# Patient Record
Sex: Male | Born: 2000 | Race: Black or African American | Hispanic: No | Marital: Single | Smoking: Never smoker
Health system: Southern US, Community
[De-identification: ages and names within clinical notes are randomized; demographics above are authoritative.]

## PROBLEM LIST (undated history)

## (undated) DIAGNOSIS — F419 Anxiety disorder, unspecified: Secondary | ICD-10-CM

## (undated) DIAGNOSIS — J309 Allergic rhinitis, unspecified: Secondary | ICD-10-CM

## (undated) DIAGNOSIS — G43909 Migraine, unspecified, not intractable, without status migrainosus: Secondary | ICD-10-CM

## (undated) DIAGNOSIS — R519 Headache, unspecified: Secondary | ICD-10-CM

## (undated) DIAGNOSIS — F329 Major depressive disorder, single episode, unspecified: Secondary | ICD-10-CM

## (undated) DIAGNOSIS — F909 Attention-deficit hyperactivity disorder, unspecified type: Secondary | ICD-10-CM

## (undated) DIAGNOSIS — R5383 Other fatigue: Secondary | ICD-10-CM

## (undated) DIAGNOSIS — M549 Dorsalgia, unspecified: Secondary | ICD-10-CM

## (undated) DIAGNOSIS — E669 Obesity, unspecified: Secondary | ICD-10-CM

## (undated) DIAGNOSIS — F32A Depression, unspecified: Secondary | ICD-10-CM

## (undated) DIAGNOSIS — E66811 Obesity, class 1: Secondary | ICD-10-CM

## (undated) HISTORY — DX: Headache, unspecified: R51.9

## (undated) HISTORY — DX: Attention-deficit hyperactivity disorder, unspecified type: F90.9

## (undated) HISTORY — DX: Migraine, unspecified, not intractable, without status migrainosus: G43.909

## (undated) HISTORY — PX: NO PAST SURGERIES: SHX2092

## (undated) HISTORY — DX: Other fatigue: R53.83

## (undated) HISTORY — DX: Dorsalgia, unspecified: M54.9

## (undated) HISTORY — DX: Anxiety disorder, unspecified: F41.9

## (undated) HISTORY — DX: Depression, unspecified: F32.A

## (undated) HISTORY — DX: Obesity, class 1: E66.811

## (undated) HISTORY — DX: Obesity, unspecified: E66.9

## (undated) HISTORY — DX: Allergic rhinitis, unspecified: J30.9

---
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
## 2019-01-02 ENCOUNTER — Encounter: Payer: Self-pay | Admitting: Psychiatry

## 2019-01-02 ENCOUNTER — Other Ambulatory Visit: Payer: Self-pay

## 2019-01-02 ENCOUNTER — Ambulatory Visit (INDEPENDENT_AMBULATORY_CARE_PROVIDER_SITE_OTHER): Payer: BC Managed Care – PPO | Admitting: Psychiatry

## 2019-01-02 VITALS — Ht 69.0 in | Wt 219.0 lb

## 2019-01-02 DIAGNOSIS — F902 Attention-deficit hyperactivity disorder, combined type: Secondary | ICD-10-CM

## 2019-01-02 DIAGNOSIS — F411 Generalized anxiety disorder: Secondary | ICD-10-CM

## 2019-01-02 DIAGNOSIS — F410 Panic disorder [episodic paroxysmal anxiety] without agoraphobia: Secondary | ICD-10-CM | POA: Diagnosis not present

## 2019-01-02 MED ORDER — DEXMETHYLPHENIDATE HCL ER 15 MG PO CP24: 15.0000 mg | ORAL_CAPSULE | Freq: Every day | ORAL | 0 refills | Status: DC

## 2019-01-02 MED ORDER — LORAZEPAM 1 MG PO TABS: 1.0000 mg | ORAL_TABLET | Freq: Two times a day (BID) | ORAL | 0 refills | Status: DC | PRN

## 2019-01-02 NOTE — Progress Notes (Signed)
[facility]  Patient ID: Wendall Isabell,  MRN: [id number]  PCP: Farris Has, MD  Date of Evaluation: 01/02/2019 Time spent:20 minutes from 0920 to 0940  Chief Complaint:  Chief Complaint    Panic Attack; Anxiety; ADHD      HISTORY/CURRENT STATUS: Richards is seen onsite in office individually 20 minutes face-to-face with consent with epic collateral for adolescent psychiatric interview in exam and 3-week evaluation and management of panic and generalized anxiety possibly more social anxiety with ADHD undermining transition to adulthood and career.  The patient required hospitalization in mid October not complying with Lexapro after discharge diagnosed with depression he denied being very quiet and almost mute at times though denying that he is shy.  However, panic anxiety has been the most debilitating symptommaking it difficult to leave the house, take part in activities, and prepare for the future.  He has been previously unable to tolerate these appointments in the past but does better today, talking still with the averted gaze and low volume but persisting throughout the 20 minutes then expressing thanks for accomplishing the session.  He has taken up to 60 tablets of lorazepam potentially every 3 weeks prescribed as 3 times daily as needed noting that initially the medication seemed to help him have less frequent anxiety but now the episodes are more frequent again though he has fear and aversion to preventative medication. He suggests last week and last night were both very difficult for many symptoms. He did receive his PC delivery and has been working on assemblying his computer.  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  He still declines the duloxetine suggested 20 mg daily.  He will not go back to the previous Lexapro.  He does inquire about options for ADHD treatment wondering if he can focus and function better, maybe he can become more successful at  extincting the panic and restoring an active life.  He does arrive on time today as another low-level indicator of engagement and investment in treatment with less anxiety.  He denies cannabis currently or other substance use.  He has no mania, suicidality, psychosis or delirium.  Anxiety Presents for follow up of problem starting 1 to 5 years ago. The problem has been waxing and waning. Symptoms include decreased concentration, dizziness, excessive worry, malaise, muscle tension, nausea, nervous/anxious behavior, panic, avoidance, inhibition, and restlessness. Patient reports no chest pain, compulsions, depressed mood, feeling of choking, obsessions or suicidal ideas. Symptoms occur multiple times dailly. The severe symptoms interfere with daily activities and moderate. The symptoms are aggravated by family issues, social activities and work stress. The quality of sleep is fair. Nighttime awakenings: one to two.   Individual Medical History/ Review of Systems: Changes? :No   Allergies: Pollen extract  Current Medications:  Current Outpatient Medications:  .  dexmethylphenidate (FOCALIN XR) 15 MG 24 hr capsule, Take 1 capsule (15 mg total) by mouth daily after breakfast., Disp: 30 capsule, Rfl: 0 .  DULoxetine HCl 20 MG CSDR, Take 20 mg by mouth at bedtime., Disp: 30 capsule, Rfl: 1 .  LORazepam (ATIVAN) 1 MG tablet, Take 1 tablet (1 mg total) by mouth 2 (two) times daily as needed for anxiety (panic)., Disp: 60 tablet, Rfl: 0 .  traZODone (DESYREL) 50 MG tablet, Take 1 tablet (50 mg total) by mouth at bedtime as needed for sleep., Disp: 15 tablet, Rfl: 0   Medication Side Effects: none  Family Medical/ Social History: Changes? No  MENTAL  HEALTH EXAM:  Height 5\' 9"  (1.753 m), weight 219 lb (99.3 kg).Body mass index is 32.34 kg/m. Muscle strengths and tone 5/5, postural reflexes and gait 0/0, and AIMS = 0 otherwise deferred for coronavirus shutdown  General Appearance: Casual, Fairly Groomed,  Guarded, Meticulous and Obese  Eye Contact:  Minimal  Speech:  Blocked, Clear and Coherent, Normal Rate and Talkative  Volume:  Normal  Mood:  Anxious, Euthymic and Worthless  Affect:  Congruent, Inappropriate, Restricted and Anxious  Thought Process:  Coherent, Goal Directed, Irrelevant, Linear and Descriptions of Associations: Tangential  Orientation:  Full (Time, Place, and Person)  Thought Content: Ilusions, Rumination and Tangential   Suicidal Thoughts:  No  Homicidal Thoughts:  No  Memory:  Immediate;   Good Remote;   Good  Judgement:  Fair  Insight:  Fair  Psychomotor Activity:  Normal, Decreased and Mannerisms  Concentration:  Concentration: Fair and Attention Span: Poor  Recall:  Good  Fund of Knowledge: Good  Language: Fair  Assets:  Desire for Improvement Resilience Talents/Skills  ADL's:  Intact  Cognition: WNL  Prognosis:  Good    DIAGNOSES:    ICD-10-CM   1. Panic disorder  F41.0 LORazepam (ATIVAN) 1 MG tablet  2. Generalized anxiety disorder  F41.1 LORazepam (ATIVAN) 1 MG tablet  3. Attention deficit hyperactivity disorder (ADHD), combined type, moderate  F90.2 dexmethylphenidate (FOCALIN XR) 15 MG 24 hr capsule    Receiving Psychotherapy: Yes with Apolonio Schneiders, PhD    RECOMMENDATIONS: Over 50% of the 20-minute face-to-face time is spent in total of 10 minutes counseling and coordination of care with cognitive behavioral exposure desensitization habit reversal response prevention behavioral nutrition, sleep hygiene, social skills, and frustration management interventions integrated as possible with the psychotherapy underway with Apolonio Schneiders, PhD and symptom treatment matching with medication.  Reviewing all medication options again including has mobilized with mother the first appointment here, he continues to decline the duloxetine 20 mg daily but accepts eScription for Focalin 15 mg XR every morning sent as #30 with no refill to [facility].   Fully anxiety is sufficiently contained with the lorazepam tolerate the Focalin and improve her personal function and responsible activity that working through anxiety can become more comfortable and successful to change over to duloxetine.  Ativan is increased to 1 mg tablet twice daily as needed for panic and social anxiety sent as #60 with no refill for generalized anxiety and panic disorders to [facility].  He returns for follow-up in 4 weeks continuing therapy with Dr. Cheryln Manly.   Delight Hoh, MD

## 2019-01-17 ENCOUNTER — Encounter: Payer: Self-pay | Admitting: Psychiatry

## 2019-01-17 ENCOUNTER — Ambulatory Visit (INDEPENDENT_AMBULATORY_CARE_PROVIDER_SITE_OTHER): Payer: BC Managed Care – PPO | Admitting: Psychiatry

## 2019-01-17 ENCOUNTER — Other Ambulatory Visit: Payer: Self-pay

## 2019-01-17 VITALS — Ht 69.0 in | Wt 214.0 lb

## 2019-01-17 DIAGNOSIS — F410 Panic disorder [episodic paroxysmal anxiety] without agoraphobia: Secondary | ICD-10-CM

## 2019-01-17 DIAGNOSIS — F411 Generalized anxiety disorder: Secondary | ICD-10-CM | POA: Diagnosis not present

## 2019-01-17 DIAGNOSIS — F902 Attention-deficit hyperactivity disorder, combined type: Secondary | ICD-10-CM

## 2019-01-17 MED ORDER — DEXMETHYLPHENIDATE HCL ER 15 MG PO CP24: 15.0000 mg | ORAL_CAPSULE | Freq: Every day | ORAL | 0 refills | Status: DC

## 2019-01-17 MED ORDER — ALPRAZOLAM 1 MG PO TABS: 1.0000 mg | ORAL_TABLET | Freq: Two times a day (BID) | ORAL | 0 refills | Status: DC | PRN

## 2019-01-17 NOTE — Progress Notes (Signed)
[facility]  Patient ID: Jeremiah [name],  MRN: [id number]  PCP: Jeremiah Has, MD  Date of Evaluation: 01/17/2019 Time spent:20 minutes from 1120 to 1140  Chief Complaint:  Chief Complaint    Panic Attack; Anxiety; ADHD      HISTORY/CURRENT STATUS: Jeremiah [name] is seen onsite in office 20 minutes face-to-face with consent with epic collateral for psychiatric interview and exam in 2-week evaluation and management of panic and generalized anxiety and ADHD to rule out previous depression.  In the interim, the patient reports partial improvement with Focalin 15 mg XR finding no blunting of his personality as he experienced in the past from ADHD medication treatment, so that he is pleased with the medication though finding modest benefit for focus.  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  The cardiorespiratory panic symptoms undermine his day experiencing only modest partial relief with Ativan despite having increased to the 1 mg tablet even when he doubles that.  He is now less concerned about when the next panic will be but is more concerned about getting over the panic when it occurs.  He does not find the Focalin increases the panic frequency or severity thus far.  He continues to decline Cymbalta or other antianxiety antidepressant to help prevent the panic.  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  [place] registry documents last Xanax and Focalin dispensing on 01/02/2019.  In the meantime, the patient called the [facility] nurse line who told him about Xanax and Librium.  Patient sees Jeremiah [name] on 01/19/2018 but [name] not seen him since the last appointment here.  The patient communicates much more effectively today both in process and content.  Overall he appears to be making slow progress but worries he will not be more completely improved.   He seeks to change the Ativan and increase when possible the Focalin but allows teaching about the order of such and ways to work through one area of improvement before adding treatment on another that interrupts the previous.  He does inquire whether he can drink some alcohol on a day when he does not take any benzodiazepine as to previous education. However, overall he still states he does not drink.  Weight is down 4 to 5 pounds since Focalin.  He [name] no mania, suicidality, psychosis or delirium.  Anxiety Presents forfollow up of problem starting1 to 5 years ago. The problem [name] beengradually improving. Symptoms includedecreased concentration,dizziness,excessive worry,muscle tension,nausea,nervous/anxious behavior,panic, avoidance, and inhibition.. Patient reports nochest pain,compulsions,depressed mood, malaise,restlessness, feeling of choking,obsessionsor suicidal ideas. Symptoms occur multiple times dailly. The severe symptomsinterfere with daily activities and moderate. The symptoms are aggravated byfamily issues, social activities and work stress. The quality of sleep isfair. Nighttime awakenings:one to two.   Individual Medical History/ Review of Systems: Changes? :No With ongoing stepwise adjustments of medications likely partially required by patient's access to social media having interested in music production, there are no contraindications to his and [employer] nurse request for Xanax though generally being a medication that is more potentially problematic than Ativan.  Allergies: Pollen extract  Current Medications:  Current Outpatient Medications:  .  ALPRAZolam (XANAX) 1 MG tablet, Take 1 tablet (1 mg total) by mouth 2 (two) times daily as needed for anxiety (panic attack)., Disp: 60 tablet, Rfl: 0 .  [START ON 02/01/2019] dexmethylphenidate (FOCALIN XR) 15  MG 24 hr capsule, Take 1 capsule (15 mg total) by mouth daily after breakfast., Disp: 30 capsule, Rfl: 0 .   DULoxetine HCl 20 MG CSDR, Take 20 mg by mouth at bedtime., Disp: 30 capsule, Rfl: 1 .  traZODone (DESYREL) 50 MG tablet, Take 1 tablet (50 mg total) by mouth at bedtime as needed for sleep., Disp: 15 tablet, Rfl: 0  Medication Side Effects: none  Family Medical/ Social History: Changes? No  MENTAL HEALTH EXAM:  Height 5\' 9"  (1.753 m), weight 214 lb (97.1 kg).Body mass index is 31.6 kg/m. Muscle strengths and tone 5/5, postural reflexes and gait 0/0, and AIMS = 0 otherwise deferred for coronavirus shutdown  General Appearance: Casual, Guarded, Meticulous and Obese  Eye Contact:  Fair  Speech:  Clear and Coherent and Normal Rate  Volume:  Decreased  Mood:  Anxious, Dysphoric, Euthymic and Irritable  Affect:  Congruent, Inappropriate, Restricted and Anxious  Thought Process:  Coherent, Irrelevant, Linear and Descriptions of Associations: Tangential  Orientation:  Full (Time, Place, and Person)  Thought Content: Rumination and Tangential   Suicidal Thoughts:  No  Homicidal Thoughts:  No  Memory:  Immediate;   Good Remote;   Fair  Judgement:  Fair  Insight:  Fair and Lacking  Psychomotor Activity:  Normal and Increased  Concentration:  Concentration: Fair and Attention Span: Fair  Recall:  [employer] of Knowledge: Good  Language: Good  Assets:  Desire for Improvement Resilience Talents/Skills  ADL's:  Intact  Cognition: WNL  Prognosis:  Fair    DIAGNOSES:    ICD-10-CM   1. Panic disorder  F41.0 ALPRAZolam (XANAX) 1 MG tablet  2. Attention deficit hyperactivity disorder (ADHD), combined type, moderate  F90.2 dexmethylphenidate (FOCALIN XR) 15 MG 24 hr capsule  3. Generalized anxiety disorder  F41.1 ALPRAZolam (XANAX) 1 MG tablet    Receiving Psychotherapy: Yes  with Jeremiah Schneiders, PhD    RECOMMENDATIONS: Negative interpretations himself of past ADHD treatment leave patient constricted in approaching treatment of his panic.  We attempt to mobilize more complete  participation in treatment by overcoming the anxiety and executive dysfunction alienating the patient since ADHD treatment in childhood.  In processing all that he is learning, changing Ativan to Xanax will be his next step for stabilizing panic and generalized anxiety before considering increasing Focalin or adding IR booster dose.  I educate him again on Cymbalta options as well, as he still [name] never started the 20 mg daily he did fill.  Over 50% of the 20-minute face-to-face session is spent in such counseling and coordination of care for a total of 10 minutes as patient is much more communicative and differentiating today for learning and applying social skills, response prevention for panic, and task completion despite anxiety and ADHD.  He is E scribed Xanax in place of discontinued Ativan 1 mg twice daily as needed for panic sent as #60 with no refill to [facility].  Follow-up eScription for Focalin 15 mg XR every morning is sent for January 20th fill if taking consistently otherwise discussing options to be continued at next appointment and will see Dr. Cheryln Manly 01/19/2018 in the interim.  He otherwise returns in 3 weeks understanding prevention and monitoring and safety hygiene including any use of alcohol in the interim outside of time of his other medication.   Delight Hoh, MD

## 2019-01-30 ENCOUNTER — Ambulatory Visit: Payer: BC Managed Care – PPO | Admitting: Psychiatry

## 2019-02-07 ENCOUNTER — Other Ambulatory Visit: Payer: Self-pay

## 2019-02-07 ENCOUNTER — Encounter: Payer: Self-pay | Admitting: Psychiatry

## 2019-02-07 ENCOUNTER — Ambulatory Visit (INDEPENDENT_AMBULATORY_CARE_PROVIDER_SITE_OTHER): Payer: BC Managed Care – PPO | Admitting: Psychiatry

## 2019-02-07 VITALS — Ht 69.0 in | Wt 217.0 lb

## 2019-02-07 DIAGNOSIS — F902 Attention-deficit hyperactivity disorder, combined type: Secondary | ICD-10-CM

## 2019-02-07 DIAGNOSIS — F410 Panic disorder [episodic paroxysmal anxiety] without agoraphobia: Secondary | ICD-10-CM | POA: Diagnosis not present

## 2019-02-07 DIAGNOSIS — F4010 Social phobia, unspecified: Secondary | ICD-10-CM | POA: Diagnosis not present

## 2019-02-07 DIAGNOSIS — F411 Generalized anxiety disorder: Secondary | ICD-10-CM

## 2019-02-07 MED ORDER — ALPRAZOLAM 1 MG PO TABS: 1.0000 mg | ORAL_TABLET | Freq: Three times a day (TID) | ORAL | 1 refills | Status: DC | PRN

## 2019-02-07 MED ORDER — DEXMETHYLPHENIDATE HCL ER 15 MG PO CP24: 15.0000 mg | ORAL_CAPSULE | Freq: Every day | ORAL | 0 refills | Status: DC

## 2019-02-07 NOTE — Progress Notes (Signed)
[facility]  Patient ID: Jeremiah [name],  MRN: [id number]  PCP: London Pepper, MD  Date of Evaluation: 02/07/2019 Time spent:20 minutes from 1125 to 1145  Chief Complaint:  Chief Complaint    Panic Attack; Anxiety; ADHD      HISTORY/CURRENT STATUS: [name] is seen onsite in office 20 minutes face-to-face individually with consent with epic collateral for psychiatric interview and exam in 3-week evaluation and management of panic and generalized anxiety likely also social anxiety and ADHD with possible depression concluded by the hospital inpatient care denied by the patient but clinical course of the last 3 weeks raising that differential again.  The patient has not seen Dr. Cheryln Manly though he greatly needs therapy, but he states he has an appointment in February.  The patient changed from Ativan 0.5 mg to Xanax 1 mg after talking to his [employer] but he states he is already tolerant to that.  Still he has not completed the first bottle of 60 having a few tablets remaining and he did not obtain the Focalin from last appointment 01/17/2019 according to [facility] registry.  He is talking less clearly today mumbling with anxious avoidance though not intoxication showing less comfort with the therapeutic process in the session.  Clinically this seems to represent social anxiety patient having poor eye contact and low-volume voice mumbling in his Covid mask with fewer specifics as to activity or symptoms.  His difficulty conversing seems to likely explain explain lack of content rather than the patient not knowing the content or attempting to mask the content.  He wonders still if higher doses of Xanax would resolve all of his symptoms, whereas I again remind him of the Cymbalta prescription he states he filled but has not started in place of Lexapro from the hospital which initial aftercare with the [place] wanted increased the  from 10 to 20 mg Lexapro after  leaving the hospital but he came here instead off that medication.  Extending the time between appointments has been stressful to the patient but may be the only way to define more clearly the treatment targets.  Boundaries and limits are therefore set with the patient for the Xanax and the Focalin encouraging the Cymbalta also be applied for 2 months as he works with Dr. Cheryln Manly to return at that time for reassessment.  Has no mania, suicidality, psychosis or delirium.  Anxiety  Presents forfollow up of problem starting1 to 5 years ago. The problem had beengradually improving but is now waxing and waning if not getting worse again. Symptoms includedecreased concentration, low motivation initiative,excessive worry,muscle tension,obsessive slowness, nervous/anxious behavior,panic,avoidance, and inhibition. Patient reports nochest pain,dizziness, nausea, compulsions,depressed mood, malaise,restlessness, feeling of choking,obsessionsor suicidal ideas. Symptoms occurmultiple timesdailly. The severesymptomsinterferewith daily activities and moderate. The symptoms are aggravated byfamily issues, social activities and work stress. The quality of sleep isfair. Nighttime awakenings:one to two.  Individual Medical History/ Review of Systems: Changes? :Yes weight varying between 214 and 219 pounds now 217.  Allergies: Pollen extract  Current Medications:  Current Outpatient Medications:  .  ALPRAZolam (XANAX) 1 MG tablet, Take 1 tablet (1 mg total) by mouth 3 (three) times daily as needed for anxiety (panic attack)., Disp: 90 tablet, Rfl: 1 .  dexmethylphenidate (FOCALIN XR) 15 MG 24 hr capsule, Take 1 capsule (15 mg total) by mouth daily after breakfast., Disp: 30 capsule, Rfl: 0 .  [START ON 03/09/2019] dexmethylphenidate (FOCALIN XR) 15 MG 24 hr capsule, Take 1 capsule (15 mg total)  by mouth daily., Disp: 30 capsule, Rfl: 0 .  DULoxetine HCl 20 MG CSDR, Take 20 mg by mouth at bedtime.,  Disp: 30 capsule, Rfl: 1 .  traZODone (DESYREL) 50 MG tablet, Take 1 tablet (50 mg total) by mouth at bedtime as needed for sleep., Disp: 15 tablet, Rfl: 0   Medication Side Effects: none  Family Medical/ Social History: Changes? No  MENTAL HEALTH EXAM:  Height 5\' 9"  (1.753 m), weight 217 lb (98.4 kg).Body mass index is 32.05 kg/m. Muscle strengths and tone 5/5, postural reflexes and gait 0/0, and AIMS = 0 otherwise deferred for coronavirus shutdown  General Appearance: Casual, Fairly Groomed, Guarded and Obese  Eye Contact:  Minimal  Speech:  Blocked, Garbled and Normal Rate  Volume:  Decreased  Mood:  Anxious, Dysphoric, Euthymic and Worthless  Affect:  Congruent, Inappropriate, Restricted and Anxious  Thought Process:  Coherent, Irrelevant, Linear and Descriptions of Associations: Tangential  Orientation:  Full (Time, Place, and Person)  Thought Content: Logical, Paranoid Ideation, Rumination and Tangential   Suicidal Thoughts:  No  Homicidal Thoughts:  No  Memory:  Immediate;   Good Remote;   Fair  Judgement:  Fair to limited  Insight:  Fair and Lacking  Psychomotor Activity:  Normal, Increased, Decreased, Mannerisms and Restlessness  Concentration:  Concentration: Fair and Attention Span: Fair  Recall:  of Knowledge: Good  Language: Fair  Assets:  Leisure Time Resilience Talents/Skills  ADL's:  Intact  Cognition: WNL  Prognosis:  Fair    DIAGNOSES:    ICD-10-CM   1. Panic disorder  F41.0 ALPRAZolam (XANAX) 1 MG tablet  2. Social anxiety disorder  F40.10 ALPRAZolam (XANAX) 1 MG tablet  3. Generalized anxiety disorder  F41.1 ALPRAZolam (XANAX) 1 MG tablet  4. Attention deficit hyperactivity disorder (ADHD), combined type, moderate  F90.2 dexmethylphenidate (FOCALIN XR) 15 MG 24 hr capsule    dexmethylphenidate (FOCALIN XR) 15 MG 24 hr capsule    DISCONTINUED: dexmethylphenidate (FOCALIN XR) 15 MG 24 hr capsule    Receiving Psychotherapy: Yes With Dr.  [employer], PhD in early February   RECOMMENDATIONS: Initial stepwise improvement in 4 sessions over 10 weeks as if exposure response prevention CBT manifesting some gradual success in combination with medications seems now regressed in the last 3 weeks as the patient relatively shuts down as if limited intent and attempt on his part to participate in treatment whether increased anxiety, overmedicating contributing to expectation for more, or just a difficult anxious disappointing or depressing day.  As there is no other clinical technique for the session to clarify by questions of the patient and observations of his responses necessary explanation, we minimize any change in medication expecting his therapy to restart with Dr. March while patient must reconsider starting the Cymbalta.  He has not been taking the Focalin that I can determine though he states it is of value without adverse effect so that medication is updated as well.  As the patient does not establish goals himself nor does he resolve concerns with pharmacy, the uncertainty of any fill or failure to be received by the pharmacy of the Focalin prompts resending the Focalin having to entry error the use of the existing eScription to establish two new escriptions of Focalin 15 mg XR #30 each for January 26 and February 25 explaining these to the pharmacy in note.  Xanax is increased to 1 mg 3 times daily as he describes never catching up with the anxiety due to  tolerance not likely for the dosing schedule or potency of dose so that he may be describing lack of efficacy but if so needs to respond more than just doubling Xanax so E scribed as #90 with 1 refill [facility] for panic and generalized anxiety with additional newly added diagnosis of social anxiety to the existing ADHD with rule out differential of major depression.  It is necessary to extend the return time to 2 months from the current 3 weeks to expect the patient to make  more effort himself in his daily life integrated with his therapy to determine any subsequent changes necessary in medication.   Chauncey Mann, MD

## 2019-02-20 ENCOUNTER — Ambulatory Visit (INDEPENDENT_AMBULATORY_CARE_PROVIDER_SITE_OTHER): Payer: BC Managed Care – PPO | Admitting: Psychology

## 2019-02-20 DIAGNOSIS — F411 Generalized anxiety disorder: Secondary | ICD-10-CM

## 2019-03-13 ENCOUNTER — Ambulatory Visit (INDEPENDENT_AMBULATORY_CARE_PROVIDER_SITE_OTHER): Payer: BC Managed Care – PPO | Admitting: Psychology

## 2019-03-13 DIAGNOSIS — F411 Generalized anxiety disorder: Secondary | ICD-10-CM

## 2019-03-22 ENCOUNTER — Ambulatory Visit (INDEPENDENT_AMBULATORY_CARE_PROVIDER_SITE_OTHER): Payer: BC Managed Care – PPO | Admitting: Psychiatry

## 2019-03-22 ENCOUNTER — Encounter: Payer: Self-pay | Admitting: Psychiatry

## 2019-03-22 ENCOUNTER — Other Ambulatory Visit: Payer: Self-pay

## 2019-03-22 VITALS — Ht 69.0 in | Wt 220.0 lb

## 2019-03-22 DIAGNOSIS — F411 Generalized anxiety disorder: Secondary | ICD-10-CM | POA: Diagnosis not present

## 2019-03-22 DIAGNOSIS — F341 Dysthymic disorder: Secondary | ICD-10-CM

## 2019-03-22 DIAGNOSIS — F410 Panic disorder [episodic paroxysmal anxiety] without agoraphobia: Secondary | ICD-10-CM | POA: Diagnosis not present

## 2019-03-22 DIAGNOSIS — F4010 Social phobia, unspecified: Secondary | ICD-10-CM

## 2019-03-22 DIAGNOSIS — F902 Attention-deficit hyperactivity disorder, combined type: Secondary | ICD-10-CM

## 2019-03-22 MED ORDER — ALPRAZOLAM 1 MG PO TABS: 1.0000 mg | ORAL_TABLET | Freq: Three times a day (TID) | ORAL | 0 refills | Status: DC | PRN

## 2019-03-22 MED ORDER — DULOXETINE HCL 30 MG PO CPEP: 30.0000 mg | ORAL_CAPSULE | Freq: Every day | ORAL | 1 refills | Status: DC

## 2019-03-22 NOTE — Progress Notes (Signed)
[facility]  Patient ID: Jeremiah [name],  MRN: [id number]  PCP: London Pepper, MD  Date of Evaluation: 03/22/2019 Time spent:25 minutesfrom 1540 to [place]  Chief Complaint:  Chief Complaint    Panic Attack; Anxiety; Depression; ADHD      HISTORY/CURRENT STATUS: Jeremiah [name] is seen on site in office 25 minutes face-to-face individually with consent with epic collateral for psychiatric interview and exam in 6-week evaluation and management of panic disorder, generalized and social anxiety, ADHD and denial of depressive diagnosis.  Patient presented here for treatment maintaining that his intervention by [facility] and any surrounding care had incorrectly diagnosed depression while maintaining he just has anxiety and ADHD fixating his adult development.  However, in his medication management initially he stated he had picked up but not started Cymbalta at 20 mg daily, now maintaining that he never picked up the Cymbalta eScription to replace previous Lexapro he did not tolerate or accept compliantly.  He has been dispensed the Focalin once in the interim as per [facility] registry January 26 on the day of last appointment here, and the Xanax was last on February 1 therefore taking the Focalin at most 66% of the days.  He states the Focalin helps but seems to increase his anxiety as he estimates requiring the Xanax 1 to 3 tablets daily sometimes taking 2 at once.  He states he is not sleeping good as possibly another reason for not taking the Focalin daily.  However he has gained 3 pounds in the interim not losing any weight relative to Focalin suppression of appetite.  He did see Dr. Cheryln Manly for at least 1 session in the interim and states they concluded that treating depression as well as the anxiety and ADHD is important, and he recommended that the patient comply with the antidepressant.  Therefore the patient is now willing to start the Cymbalta and seems more genuinely motivated to succeed but  anxious and doubtful about treatment or success.  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  I encouraged him to bring that or other aspects of his evolving career for review at next session as he appears likely to continue treatment, though seeming to avoid as though closing down last appointment as if he just wanted more Xanax and otherwise no treatment.  He has no mania, suicidality, psychosis or delirium.  Otherwise, he then requests a sleeping pill today but accepts that cannot be combined with Xanax but can be switched over such as to Restoril.  Anxiety Presents forfollow up of problem starting1 to 5 years ago. The problem has beengradually improving. Symptoms includedecreased concentration,dizziness,excessive worry,muscle tension,nausea,nervous/anxious behavior,panic,avoidance,  reactive dysphoria, and inhibition.. Patient reports nochest pain,compulsions, malaise,restlessness, feeling of choking,obsessionsor suicidal ideas. Symptoms occurmultiple timesdailly. The severesymptomsinterferewith daily activities and moderate. The symptoms are aggravated byfamily issues, social activities and work stress. The quality of sleep isfair. Nighttime awakenings:one to two.  Individual Medical History/ Review of Systems: Changes? :Yes Weight is up 3 pounds from last appointment but only 2 pounds total in the last 4 months.  Allergies: Pollen extract  Current Medications:  Current Outpatient Medications:  .  ALPRAZolam (XANAX) 1 MG tablet, Take 1 tablet (1 mg total) by mouth 3 (three) times daily as needed for anxiety (panic attack)., Disp: 90 tablet, Rfl: 0 .  dexmethylphenidate (FOCALIN XR) 15 MG 24 hr capsule, Take 1 capsule (15 mg total) by mouth  daily after breakfast., Disp: 30 capsule, Rfl: 0 .  dexmethylphenidate (FOCALIN XR) 15 MG 24 hr capsule, Take 1 capsule (15 mg total) by  mouth daily., Disp: 30 capsule, Rfl: 0 .  DULoxetine (CYMBALTA) 30 MG capsule, Take 1 capsule (30 mg total) by mouth at bedtime., Disp: 30 capsule, Rfl: 1 .  traZODone (DESYREL) 50 MG tablet, Take 1 tablet (50 mg total) by mouth at bedtime as needed for sleep., Disp: 15 tablet, Rfl: 0  Medication Side Effects: anxiety  Family Medical/ Social History: Changes? No  MENTAL HEALTH EXAM:  Height 5\' 9"  (1.753 m), weight 220 lb (99.8 kg).Body mass index is 32.49 kg/m. Muscle strengths and tone 5/5, postural reflexes and gait 0/0, and AIMS = 0 otherwise deferred for coronavirus shutdown  General Appearance: Casual, Fairly Groomed, Guarded, Meticulous and Obese  Eye Contact:  Minimal to fair  Speech:  Blocked, Clear and Coherent and Normal Rate  Volume:  Decreased  Mood:  Anxious, Depressed and Dysphoric  Affect:  Congruent, Depressed, Inappropriate, Restricted and Anxious  Thought Process:  Coherent, Goal Directed, Irrelevant, Linear and Descriptions of Associations: Tangential  Orientation:  Full (Time, Place, and Person)  Thought Content: Ilusions, Rumination and Tangential   Suicidal Thoughts:  No  Homicidal Thoughts:  No  Memory:  Immediate;   Good Remote;   Fair  Judgement:  Fair  Insight:  Lacking  Psychomotor Activity:  Normal, Decreased and Mannerisms  Concentration:  Concentration: Fair and Attention Span: Fair  Recall:  of Knowledge: Good  Language: Fair  Assets:  Desire for Improvement Resilience Talents/Skills  ADL's:  Intact  Cognition: WNL  Prognosis:  Fair    DIAGNOSES:    ICD-10-CM   1. Panic disorder  F41.0 ALPRAZolam (XANAX) 1 MG tablet    DULoxetine (CYMBALTA) 30 MG capsule  2. Social anxiety disorder  F40.10 ALPRAZolam (XANAX) 1 MG tablet    DULoxetine (CYMBALTA) 30 MG capsule  3. 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 Attention deficit hyperactivity disorder (ADHD), combined type, moderate  F90.2 DULoxetine (CYMBALTA) 30 MG capsule    Receiving Psychotherapy: Yes with With Dr. [employer], PhD   RECOMMENDATIONS: Psychosupportive psychoeducation attempts to consolidate his interim efforts seeing Dr. Caralyn Guile in the interim, deciding to start the Cymbalta, and obtaining the electric guitar.  Patient's anxiety, dysthymia, and inattention combine to undo tangentially his opportunities for success.  We continue other medications while starting Cymbalta E scribed is 30 mg to take every bedtime sent to as #30 with 1 refill to [facility] at [address] for panic, generalized anxiety, social anxiety and dysthymic disorder in addition to ADHD.  He is escribed Xanax 1 mg 3 times daily as needed for panic or social anxiety sent as #90 with no refill sent to [facility] on [address].  He has remaining supply and another fill for Focalin 15 mg XR every morning also encouraging him to take this as Cymbalta hopefully contains anxiety as he accomplishes more when he does comply with the Focalin even if having to combat the anxiety.  He returns for follow-up here in 4 weeks continuing psychotherapy with Dr. JANE PHILLIPS [facility].  Dellia Cloud, MD

## 2019-04-03 ENCOUNTER — Ambulatory Visit (INDEPENDENT_AMBULATORY_CARE_PROVIDER_SITE_OTHER): Payer: BC Managed Care – PPO | Admitting: Psychology

## 2019-04-03 DIAGNOSIS — F411 Generalized anxiety disorder: Secondary | ICD-10-CM | POA: Diagnosis not present

## 2019-04-06 ENCOUNTER — Ambulatory Visit: Payer: BC Managed Care – PPO | Admitting: Psychiatry

## 2019-04-19 ENCOUNTER — Ambulatory Visit: Payer: BC Managed Care – PPO | Admitting: Psychiatry

## 2019-04-20 ENCOUNTER — Ambulatory Visit: Payer: BC Managed Care – PPO | Attending: Internal Medicine

## 2019-04-20 DIAGNOSIS — Z20822 Contact with and (suspected) exposure to covid-19: Secondary | ICD-10-CM | POA: Insufficient documentation

## 2019-04-21 DIAGNOSIS — J988 Other specified respiratory disorders: Secondary | ICD-10-CM | POA: Diagnosis not present

## 2019-04-21 LAB — NOVEL CORONAVIRUS, NAA: SARS-CoV-2, NAA: NOT DETECTED

## 2019-04-21 LAB — SARS-COV-2, NAA 2 DAY TAT

## 2019-04-27 ENCOUNTER — Ambulatory Visit (INDEPENDENT_AMBULATORY_CARE_PROVIDER_SITE_OTHER): Payer: BC Managed Care – PPO | Admitting: Psychiatry

## 2019-04-27 ENCOUNTER — Encounter: Payer: Self-pay | Admitting: Psychiatry

## 2019-04-27 ENCOUNTER — Other Ambulatory Visit: Payer: Self-pay

## 2019-04-27 VITALS — Ht 69.0 in | Wt 215.0 lb

## 2019-04-27 DIAGNOSIS — F411 Generalized anxiety disorder: Secondary | ICD-10-CM | POA: Diagnosis not present

## 2019-04-27 DIAGNOSIS — F4010 Social phobia, unspecified: Secondary | ICD-10-CM | POA: Diagnosis not present

## 2019-04-27 DIAGNOSIS — F341 Dysthymic disorder: Secondary | ICD-10-CM | POA: Diagnosis not present

## 2019-04-27 DIAGNOSIS — F410 Panic disorder [episodic paroxysmal anxiety] without agoraphobia: Secondary | ICD-10-CM | POA: Diagnosis not present

## 2019-04-27 DIAGNOSIS — F902 Attention-deficit hyperactivity disorder, combined type: Secondary | ICD-10-CM

## 2019-04-27 NOTE — Progress Notes (Signed)
[facility]  Patient ID: Jeremiah [name],  MRN: [id number]  PCP: Farris Has, MD  Date of Evaluation: 04/27/2019 Time spent:10 minutes from 1535 to 1545  Chief Complaint:  Chief Complaint    Anxiety; Panic Attack; Depression; ADHD      HISTORY/CURRENT STATUS: Oral is seen onsite in office 10 minutes face-to-face individually with consent with epic collateral for psychiatric interview and exam in  evaluation and management of 4-week evaluation and management of panic and generalized/social anxiety, atypical dysthymia, and ADHD.  After 5 days of inpatient treatment in 11-15-2022, he has been followed now by his seventh appointment here in 5 months.  Father with bipolar disorder requiring medications died of heart attack in 11-03-2019patient then stressed by mother traveling to [place] the next January having himself separation anxiety.  The patient stopped attending [employer] except by virtual.  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  He stopped the Lexapro started in the hospital and stated that he filled the eScription for Cymbalta but never did including from last appointment despite the support here and from Dr. Dellia Cloud on such.  Cluster C defenses sustain his ambivalence about adequate medication and about recovery itself as he seems to maintain residual mourning for father interpreted as major depression in the hospital and of potential concern for substance use diathesis outpatient as he utilized phone calls to his insurance company to validate getting switching from Ativan to Xanax, starting Focalin, and never starting his Cymbalta.  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  He arrives 15 minutes late today at the same time of the next patient on the schedule for 5 minutes later having a 10-minute session  in which the patient has not filled any of the Xanax from last prescription still having remaining supply from that before and feels anxious from Focalin 15 mg so that he might take half a capsule to prevent too much anxious sleepless side effect.  registry documents last Xanax dispensing 02/13/2019 and Focalin 02/07/2019.  The patient had 1 significant episode of cardiac and pulmonary panic calling EMS for inability to get enough oxygen reassured on the scene without having to go to the ED then having a negative test for COVID 2 weeks ago.  Weight is down 5 pounds and the patient is more social having improved eye contact and verbal communication.  He is less dysphoric and more hopeful declining any medication escriptions today and planning follow-up in 2 months, all suggesting some self-directed recovery not known to be generalized to mother or family predicting some resolution of father's death so that [facility] can live on himself without resistance to growing up or depending on illness symptoms to regulate exposure.  He has no mania, suicidality, psychosis or delirium.   Individual Medical History/ Review of Systems: Changes? :Yes Allergic rhinitis has been mildly symptomatic likely setting him up for the somatics of the sensation of insufficient breathing which became the nidus for his panic.  However the saturation of the emergency medical instead of mental experience has shifted patient's focus from more and more mental treatment to using his innate personal strengths to develop self-directed resources for maturation, employment, and health promotion.  Allergies: Pollen extract  Current Medications:  Current Outpatient Medications:  .  ALPRAZolam (XANAX) 1 MG tablet, Take 1 tablet (1 mg total)  by mouth 3 (three) times daily as needed for anxiety (panic attack)., Disp: 90 tablet, Rfl: 0 .  dexmethylphenidate (FOCALIN XR) 15 MG 24 hr capsule, Take 1 capsule (15 mg total) by mouth daily after breakfast.,  Disp: 30 capsule, Rfl: 0 .  dexmethylphenidate (FOCALIN XR) 15 MG 24 hr capsule, Take 1 capsule (15 mg total) by mouth daily., Disp: 30 capsule, Rfl: 0 .  traZODone (DESYREL) 50 MG tablet, Take 1 tablet (50 mg total) by mouth at bedtime as needed for sleep., Disp: 15 tablet, Rfl: 0  Medication Side Effects: anxiety and insomnia  Family Medical/ Social History: Changes? No fsssather had mood disorder whether bipolar or unipolar major depression, mother implying that he took a lot of medication which possibly frightened or alienated in disapproval [place] as father had a heart attack and died in 11/07/2017.  Mother went to [place] for 10 days in January 2020 for which the patient had significant separation anxiety.   MENTAL HEALTH EXAM:  Height 5\' 9"  (1.753 m), weight 215 lb (97.5 kg).Body mass index is 31.75 kg/m. Muscle strengths and tone 5/5, postural reflexes and gait 0/0, and AIMS = 0 otherwise deferred for coronavirus shutdown  General Appearance: Casual, Meticulous and Obese  Eye Contact:  Fair  Speech:  Clear and Coherent, Normal Rate and Talkative  Volume:  Normal  Mood:  Anxious, Euphoric and Euthymic  Affect:  Congruent, Inappropriate, Restricted and Anxious  Thought Process:  Coherent, Goal Directed, Irrelevant, Linear and Descriptions of Associations: Tangential  Orientation:  Full (Time, Place, and Person)  Thought Content: Rumination and Tangential   Suicidal Thoughts:  No  Homicidal Thoughts:  No  Memory:  Immediate;   Good Remote;   Good to fair  Judgement:  Fair  Insight:  Fair  Psychomotor Activity:  Normal and Mannerisms  Concentration:  Concentration: Fair and Attention Span: Fair  Recall:  [employer] of Knowledge: Good  Language: Fair  Assets:  Desire for Improvement Resilience Talents/Skills  ADL's:  Intact  Cognition: WNL  Prognosis:  Fair    DIAGNOSES:    ICD-10-CM   1. Panic disorder  F41.0   2. Social anxiety disorder  F40.10   3. Persistent  depressive disorder with atypical features, currently moderate  F34.1   4. Generalized anxiety disorder  F41.1   5. Attention deficit hyperactivity disorder (ADHD), combined type, moderate  F90.2     Receiving Psychotherapy: Yes  with Apolonio Schneiders, PhD   RECOMMENDATIONS: The patient allows psychosupportive psychoeducation to clarify the clinical accomplishments he has behaviorally advanced rather than becoming dependent on medication and mental illness to stay the same in his mourning and in his youth in transition to adulthood.  He has remaining eScription from 03/10/2019 for Focalin 15 mg XR every morning and does occasionally need the full dose to accomplish the task of the day which can cause more anxiety than medication does for ADHD.  He also has 1 refill remaining on the 02/07/2019 Xanax No. 90 tablet to fill hand as well as the remaining single fill from eScription 03/22/2019 also #90 of the 1 mg Xanax 3 times daily as needed if needed beyond the existing supply for panic, generalized and social anxiety.  He returns in 2 months or sooner if needed.  Delight Hoh, MD

## 2019-05-31 ENCOUNTER — Telehealth: Payer: Self-pay | Admitting: Psychiatry

## 2019-05-31 ENCOUNTER — Telehealth: Payer: Self-pay | Admitting: Pediatrics

## 2019-05-31 NOTE — Telephone Encounter (Signed)
The refill of 05/12/2019 was from the E scription of 02/07/2019 so that there is  still on file in pharmacy computer eScription for #90 from 03/22/2019.  We may need to medically assume responsibility for his early refill having to go out of town as all the pharmacy may need, but if for some reason r=they did not have the E scription of 03/22/2019 on their system, I can send another.

## 2019-05-31 NOTE — Telephone Encounter (Signed)
Patient called and said that he needs a refill on his xanax to be [facility] to [facility] [address], He is going out of town on friday

## 2019-06-05 ENCOUNTER — Other Ambulatory Visit: Payer: Self-pay

## 2019-06-05 DIAGNOSIS — F4010 Social phobia, unspecified: Secondary | ICD-10-CM

## 2019-06-05 DIAGNOSIS — F410 Panic disorder [episodic paroxysmal anxiety] without agoraphobia: Secondary | ICD-10-CM

## 2019-06-05 DIAGNOSIS — F411 Generalized anxiety disorder: Secondary | ICD-10-CM

## 2019-06-06 NOTE — Telephone Encounter (Signed)
Error

## 2019-06-27 ENCOUNTER — Encounter: Payer: Self-pay | Admitting: Psychiatry

## 2019-06-27 ENCOUNTER — Ambulatory Visit (INDEPENDENT_AMBULATORY_CARE_PROVIDER_SITE_OTHER): Payer: BC Managed Care – PPO | Admitting: Psychiatry

## 2019-06-27 ENCOUNTER — Other Ambulatory Visit: Payer: Self-pay

## 2019-06-27 VITALS — Ht 69.0 in | Wt 225.0 lb

## 2019-06-27 DIAGNOSIS — F902 Attention-deficit hyperactivity disorder, combined type: Secondary | ICD-10-CM

## 2019-06-27 DIAGNOSIS — F4010 Social phobia, unspecified: Secondary | ICD-10-CM

## 2019-06-27 DIAGNOSIS — F410 Panic disorder [episodic paroxysmal anxiety] without agoraphobia: Secondary | ICD-10-CM

## 2019-06-27 DIAGNOSIS — F341 Dysthymic disorder: Secondary | ICD-10-CM

## 2019-06-27 DIAGNOSIS — F411 Generalized anxiety disorder: Secondary | ICD-10-CM | POA: Diagnosis not present

## 2019-06-27 MED ORDER — DEXMETHYLPHENIDATE HCL ER 15 MG PO CP24: 15.0000 mg | ORAL_CAPSULE | Freq: Every day | ORAL | 0 refills | Status: DC

## 2019-06-27 MED ORDER — ALPRAZOLAM 1 MG PO TABS: 1.0000 mg | ORAL_TABLET | Freq: Three times a day (TID) | ORAL | 2 refills | Status: DC | PRN

## 2019-06-27 NOTE — Progress Notes (Signed)
[facility]  Patient ID: Jeremiah [name],  MRN: [id number]  PCP: Jeremiah Has, MD  Date of Evaluation: 06/27/2019 Time spent:20 minutes from 1500 to 1520  Chief Complaint:  Chief Complaint    Anxiety; Panic Attack; Depression; ADHD      HISTORY/CURRENT STATUS: Jeremiah [name] is seen Onsite in office 20 minutes face-to-face individually with consent with epic collateral for psychiatric interview and exam in 41-month evaluation and management of panic/generalized/social anxiety, ADHD, and dysthymia.  Patient [name] ADHD treated at [facility] from ages 63 to 9 years after which he may have functioned better including attending [employer] until father's heart attack death in 2019-11-06father having mood disorder possibly bipolar treated with multiple medications.  He completed high school by homeschooling.  Inpatient treatment for 6 days for suicidality in 11/06/2020included diagnoses of panic though denying major depression so that he stopped Lexapro in aftercare [facility].  Cymbalta here briefly was not accepted by patient as Ativan was changed to Xanax which works better for insomnia than his trazodone from the hospital.  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  He takes Focalin infrequently now 15 mg XR with last dispensing 02/07/2019 per Jeremiah [name] registry with last Xanax being 06/02/2019.  Migraines seem much improved no longer seeing neurology, and he may not be seeing psychology Dr. Dellia [name] as much.  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  He [name] a wedding to attend in [place] where brother is marrying a lady from [place].  Patient is back in the gym working out. His weight is 5 pounds over that initial weight 7 months ago for his first visit here.  He [name] no mania, suicidality, psychosis or delirium.  Anxiety  Presents forfollow up of problem  starting over 2 years ago. The problem [name] beengradually improving recently. Symptoms includedecreased concentration,excessive worry,muscle tension,nervous/anxious behavior,prepanic,social avoidance, reactive dysphoria, andinhibition. Patient reports nochest pain, headache,dizziness, compulsions, nausea, malaise,restlessness,feeling of choking,obsessionsor suicidal ideas. Symptoms occurmultiple timesdailly. The severesymptomsinterferewith daily activities and moderate. The symptoms are aggravated byfamily issues, social activities and work stress. The quality of sleep isfair. Nighttime awakenings:one to two.  Individual Medical History/ Review of Systems: Changes? :No After losing 5 pounds he [name] regained 10 pounds to be 5 pounds over the weight of 7 months when initially seen here.  Allergies: Pollen extract  Current Medications:  Current Outpatient Medications:  .  ALPRAZolam (XANAX) 1 MG tablet, Take 1 tablet (1 mg total) by mouth 3 (three) times daily as needed for anxiety (panic attack)., Disp: 90 tablet, Rfl: 2 .  [START ON 07/02/2019] dexmethylphenidate (FOCALIN XR) 15 MG 24 hr capsule, Take 1 capsule (15 mg total) by mouth daily after breakfast., Disp: 30 capsule, Rfl: 0 .  [START ON 08/01/2019] dexmethylphenidate (FOCALIN XR) 15 MG 24 hr capsule, Take 1 capsule (15 mg total) by mouth daily., Disp: 30 capsule, Rfl: 0 .  traZODone (DESYREL) 50 MG tablet, Take 1 tablet (50 mg total) by mouth at bedtime as needed for sleep., Disp: 15 tablet, Rfl: 0   Medication Side Effects: none  Family Medical/ Social History: Changes? No  MENTAL HEALTH EXAM:  Height 5\' 9"  (1.753 m), weight 225 lb (102.1 kg).Body mass index is 33.23 kg/m. Muscle strengths and tone 5/5, postural reflexes and gait 0/0, and AIMS = 0.  General Appearance: Casual, Meticulous, Well Groomed and Obese  Eye Contact:  Fair  Speech:  Clear and Coherent, Normal Rate and Talkative  Volume:  Normal to  Decreased  Mood: Anxious, Euthymic, Dysphoric  Affect:  Congruent, Inappropriate, Restricted and Anxious  Thought Process:  Coherent, Goal Directed, Irrelevant, Linear and Descriptions of Associations: Tangential  Orientation:  Full (Time, Place, and Person)  Thought Content: Ilusions, Rumination and Tangential   Suicidal Thoughts:  No  Homicidal Thoughts:  No  Memory:  Immediate;   Good Remote;   Good and Fair  Judgement:  Fair  Insight:  Fair  Psychomotor Activity:  Normal, Increased, Mannerisms and Psychomotor Retardation  Concentration:  Concentration: Fair and Attention Span: Fair  Recall:  [employer] of Knowledge: Good  Language: Fair  Assets:  Desire for Improvement Leisure Time Resilience Talents/Skills  ADL's:  Intact  Cognition: WNL  Prognosis:  Fair    DIAGNOSES:    ICD-10-CM   1. Social anxiety disorder  F40.10 ALPRAZolam (XANAX) 1 MG tablet  2. Panic disorder  F41.0 ALPRAZolam (XANAX) 1 MG tablet  3. Attention deficit hyperactivity disorder (ADHD), combined type, moderate  F90.2 dexmethylphenidate (FOCALIN XR) 15 MG 24 hr capsule    dexmethylphenidate (FOCALIN XR) 15 MG 24 hr capsule  4. Generalized anxiety disorder  F41.1 ALPRAZolam (XANAX) 1 MG tablet  5. Persistent depressive disorder with atypical features, currently mild  F34.1     Receiving Psychotherapy: Yes   with Jeremiah Schneiders, PhD   RECOMMENDATIONS: Psychosupportive psychoeducation integrates cognitive behavioral sleep hygiene, behavioral nutrition, social skills, and frustration management with symptom treatment matching for medications.  He occasionally takes trazodone 50 mg nightly from hospital discharge supply for insomnia.  He predominantly takes as needed Xanax 1 mg 3 times daily for panic, social anxiety, or insomnia E scribed as #90 with 2 refills sent to [place] for panic/generalized/social anxiety disorders.  He is E scribed Focalin 15 mg XR daily after breakfast sent as #30 each for  June 20 and July 20 for ADHD and atypical dysthymia sent to [place].  He is encouraged to continue psychotherapy with Jeremiah [name].  He returns here for follow-up in 3 months or sooner if needed.  Delight Hoh, MD

## 2019-06-30 DIAGNOSIS — G43909 Migraine, unspecified, not intractable, without status migrainosus: Secondary | ICD-10-CM | POA: Diagnosis not present

## 2019-06-30 DIAGNOSIS — E669 Obesity, unspecified: Secondary | ICD-10-CM | POA: Diagnosis not present

## 2019-06-30 DIAGNOSIS — F341 Dysthymic disorder: Secondary | ICD-10-CM | POA: Diagnosis not present

## 2019-06-30 DIAGNOSIS — F410 Panic disorder [episodic paroxysmal anxiety] without agoraphobia: Secondary | ICD-10-CM | POA: Diagnosis not present

## 2019-09-27 ENCOUNTER — Encounter: Payer: Self-pay | Admitting: Psychiatry

## 2019-09-27 ENCOUNTER — Other Ambulatory Visit: Payer: Self-pay

## 2019-09-27 ENCOUNTER — Ambulatory Visit (INDEPENDENT_AMBULATORY_CARE_PROVIDER_SITE_OTHER): Payer: BC Managed Care – PPO | Admitting: Psychiatry

## 2019-09-27 VITALS — Ht 69.0 in | Wt 237.0 lb

## 2019-09-27 DIAGNOSIS — F4010 Social phobia, unspecified: Secondary | ICD-10-CM

## 2019-09-27 DIAGNOSIS — F411 Generalized anxiety disorder: Secondary | ICD-10-CM

## 2019-09-27 DIAGNOSIS — F902 Attention-deficit hyperactivity disorder, combined type: Secondary | ICD-10-CM

## 2019-09-27 DIAGNOSIS — F341 Dysthymic disorder: Secondary | ICD-10-CM

## 2019-09-27 DIAGNOSIS — F410 Panic disorder [episodic paroxysmal anxiety] without agoraphobia: Secondary | ICD-10-CM | POA: Diagnosis not present

## 2019-09-27 MED ORDER — ALPRAZOLAM 1 MG PO TABS: 1.0000 mg | ORAL_TABLET | Freq: Four times a day (QID) | ORAL | 2 refills | Status: DC | PRN

## 2019-09-27 NOTE — Progress Notes (Signed)
[facility]  Patient ID: Jeremiah [name],  MRN: [id number]  PCP: Farris Has, MD  Date of Evaluation: 09/27/2019 Time spent:25 minutes from 1500 to 1525  Chief Complaint:  Chief Complaint    Anxiety; Panic Attack; ADHD; Depression      HISTORY/CURRENT STATUS: Jeremiah [name] is seen onsite in office 25 minutes face-to-face conjointly with mother with consent with epic collateral for adolescent psychiatric interview and exam in 28-month evaluation and management of social, panic and generalized anxiety, dysthymia, and ADHD.  Mother attended the first appointment with patient but not in the interim until now in the ninth appointment over the last 10 months, having discontinued his psychotherapy with Dr. Dellia Cloud.  Anxiety seems likely primary along with ADHD while dysthymia more secondary is still mostly denied by patient and mother, all seeming to exacerbate with consequences following the death of father from heart attack in October 2019, having difficult to treat mood disorder despite many medications as the only family mental health history.  Therefore second anniversary of loss of father may significantly be approaching, and in the interim the patient did fly to [place] for  brother's wedding with much anxiety from the flight but patient and mother speak favorably of the wedding experience.  Patient was treated in latency for ADHD by [facility] subsequently referred to [facility] treated with Lexapro.  Inpatient for depression and panic with suicide risk reported adequate participation by patient but he did not continue trazodone, Vistaril, and Lexapro after discharge reporting the only medication that helped was as needed Ativan in the hospital analogous to treating previous migraines with Maxalt. [place] Registry documents patient is not compliant with Focalin started early in the course of his care here 15 mg XR with last dispensing 02/07/2019 and last Xanax 08/28/2019.  Patient did not acknowledge  significant outpatient benefit with Ativan and was switched to Xanax as both he and mother expressed understanding of the risk of such medications at start of treatment here and again now, finding it to be the only benefit for his anxiety as they continue denial of significant consequences of depression and ADHD for the patient currently.  Patient discontinued Lexapro following hospitalization, and he was prescribed Cymbalta low-dose through this office but never took it significantly.  They suggest that he identifies with mother more than father but remains very anxious over father's death and mother's subsequent travel to [place].  He stopped attending [employer] when father died finishing high school by home school.  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  He is also considering a job.  He attends the gym less often if at all and has gained 12 pounds in 3 months.  He has no mania, suicidality, psychosis or delirium.  Anxiety             Presents forfollow up of problem starting over 2 years ago. The problem has beengradually improving recently. Symptoms includedecreased concentration,excessive worry,muscle tension,nervous/anxious behavior,panic attacks,social avoidance,apathetic dysphoria,andsocial inhibition. Patient reports nochest pain, headache,dizziness, compulsions, nausea, malaise,restlessness,feeling of choking,obsessionsor suicidal ideas. Symptoms occurmultiple timesdailly. The severesymptomsinterferewith daily activities and moderate. The symptoms are aggravated byfamily issues, social activities and work stress. The quality of sleep isfair. Nighttime awakenings:one to two.  Individual Medical History/ Review of Systems: Changes? :Yes Weight gain of 12 pounds in 3 months having family  history  of diabetes mellitus and hypertension.  Allergies: Pollen extract  Current Medications:  Current Outpatient Medications:  .  ALPRAZolam (XANAX) 1 MG tablet, Take 1 tablet (1 mg total) by mouth 4 (four) times daily as needed for anxiety (panic attack)., Disp: 120 tablet, Rfl: 2 .  traZODone (DESYREL) 50 MG tablet, Take 1 tablet (50 mg total) by mouth at bedtime as needed for sleep., Disp: 15 tablet, Rfl: 0   Medication Side Effects: none  Family Medical/ Social History: Changes? No, patient also reports no significant cannabis currently though patient is less revealing whether from 74-month interval between appointments or presence of mother.  MENTAL HEALTH EXAM:  Height 5\' 9"  (1.753 m), weight 237 lb (107.5 kg).Body mass index is 35 kg/m. Muscle strengths and tone 5/5, postural reflexes and gait 0/0, and AIMS = 0.  General Appearance: Casual, Guarded, Meticulous, Well Groomed and Obese  Eye Contact:  Minimal  Speech:  Blocked, Clear and Coherent and Normal Rate  Volume:  Decreased  Mood:  Anxious, Dysphoric, Euthymic and Worthless  Affect:  Congruent, Inappropriate, Restricted and Anxious  Thought Process:  Coherent, Irrelevant, Linear and Descriptions of Associations: Tangential  Orientation:  Full (Time, Place, and Person)  Thought Content: Rumination and Tangential   Suicidal Thoughts:  No  Homicidal Thoughts:  No  Memory:  Immediate;   Good Remote;   Good  Judgement:  Fair  Insight:  Fair  Psychomotor Activity:  Normal, Decreased and Mannerisms  Concentration:  Concentration: Fair and Attention Span: Fair  Recall:  of Knowledge: Good  Language: Fair  Assets:  Leisure Time Resilience Talents/Skills  ADL's:  Intact  Cognition: WNL  Prognosis:  Fair    DIAGNOSES:    ICD-10-CM   1. Social anxiety disorder  F40.10 ALPRAZolam (XANAX) 1 MG tablet    DISCONTINUED: ALPRAZolam (XANAX) 1 MG tablet  2. Panic disorder  F41.0 ALPRAZolam (XANAX) 1 MG tablet     DISCONTINUED: ALPRAZolam (XANAX) 1 MG tablet  3. Generalized anxiety disorder  F41.1 ALPRAZolam (XANAX) 1 MG tablet    DISCONTINUED: ALPRAZolam (XANAX) 1 MG tablet  4. 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  They can accept review of relief and benefits of [employer] and/or employment as well as limiting Xanax in the future to as needed for panic or high anxiety socially.  They do not acknowledge any benefit likely from returning early for subsequent appointments considering patient to be more motivated by 90-month appointments than resuming monthly appointments here or therapy appointments such as with Dr. 2-month or here.  In addressing all these issues, patient and mother conclude continued Xanax currently in the interim while patient resolves anniversary of loss of father and then his own hospitalization.  My upcoming retirement is addressed including for options for early transfer to advanced practitioner here or return to see me in 3 months for follow-up, also reviewing other resources in the community.  He is E scribed Xanax 1 mg 4 times daily as needed for social anxiety or panic sent as #120 with 2 refills to [facility] for panic, social, and generalized anxiety disorders.  Delight Hoh, MD

## 2019-10-25 DIAGNOSIS — H6123 Impacted cerumen, bilateral: Secondary | ICD-10-CM | POA: Diagnosis not present

## 2019-11-01 ENCOUNTER — Encounter: Payer: Self-pay | Admitting: Psychiatry

## 2019-11-22 ENCOUNTER — Ambulatory Visit: Payer: BC Managed Care – PPO | Admitting: Adult Health

## 2019-11-27 ENCOUNTER — Other Ambulatory Visit: Payer: Self-pay

## 2019-11-27 ENCOUNTER — Ambulatory Visit (INDEPENDENT_AMBULATORY_CARE_PROVIDER_SITE_OTHER): Payer: BC Managed Care – PPO | Admitting: Psychiatry

## 2019-11-27 ENCOUNTER — Encounter: Payer: Self-pay | Admitting: Psychiatry

## 2019-11-27 VITALS — Ht 69.0 in | Wt 240.0 lb

## 2019-11-27 DIAGNOSIS — F341 Dysthymic disorder: Secondary | ICD-10-CM

## 2019-11-27 DIAGNOSIS — F410 Panic disorder [episodic paroxysmal anxiety] without agoraphobia: Secondary | ICD-10-CM

## 2019-11-27 DIAGNOSIS — F4010 Social phobia, unspecified: Secondary | ICD-10-CM

## 2019-11-27 DIAGNOSIS — F902 Attention-deficit hyperactivity disorder, combined type: Secondary | ICD-10-CM

## 2019-11-27 DIAGNOSIS — F411 Generalized anxiety disorder: Secondary | ICD-10-CM | POA: Diagnosis not present
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
## 2019-12-27 ENCOUNTER — Encounter: Payer: Self-pay | Admitting: Adult Health

## 2019-12-27 ENCOUNTER — Ambulatory Visit (INDEPENDENT_AMBULATORY_CARE_PROVIDER_SITE_OTHER): Payer: BC Managed Care – PPO | Admitting: Adult Health

## 2019-12-27 ENCOUNTER — Other Ambulatory Visit: Payer: Self-pay

## 2019-12-27 DIAGNOSIS — F4010 Social phobia, unspecified: Secondary | ICD-10-CM | POA: Diagnosis not present

## 2019-12-27 DIAGNOSIS — F410 Panic disorder [episodic paroxysmal anxiety] without agoraphobia: Secondary | ICD-10-CM | POA: Diagnosis not present

## 2019-12-27 DIAGNOSIS — F331 Major depressive disorder, recurrent, moderate: Secondary | ICD-10-CM

## 2019-12-27 NOTE — Progress Notes (Signed)
Jeremiah [name] [id number] Sep 10, 2000 19 y.o.  Subjective:   Patient ID:  Jeremiah [name] is a 19 y.o. (DOB 2000/02/13) male.  Chief Complaint: No chief complaint on file.   HPI   Accompanied by mother. Mother reports she feels like he is doing alright.  Jeremiah [name] presents to the office today for follow-up of panic attacks and social anxiety disorder.   Describes mood today as "ok". Pleasant. Mood symptoms - denies depression, anxiety, and irritability. Stating "I have been doing pretty good". Denies panic attacks. Feels like the Xanax continues to work well for him. Plans to see Jeremiah [name] for therapy. Stable interest and motivation. Taking medications as prescribed.  Energy levels Active, does not have a regular exercise routine. Works full-time  Enjoys some usual interests and activities. Single. Has a girlfriend of 1 year. Lives with mother and bunny. Spending time with family. Appetite adequate. Weight stable 237.2. Sleeps better some nights than others. Stating "I have nights that I don't sleep".   Focus and concentration stable. Completing tasks. Managing aspects of household. 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 Denies SI or HI.  Denies AH or VH.  Previous medication trials: Trazadone, Cymbalta, Lexapro, Hydroxyzine   AIMS   Flowsheet Row [facility]  AIMS Total Score 0    AUDIT   Flowsheet Row Admission (Discharged) [facility]  Alcohol Use Disorder Identification Test Final Score (AUDIT) 0       Review of Systems:  Review of Systems  Musculoskeletal: Negative for gait problem.  Neurological: Negative for tremors.  Psychiatric/Behavioral:       Please refer to HPI    Medications: I have reviewed the patient's current medications. Scheduled:  Current Outpatient Medications  Medication Sig Dispense Refill  .  ALPRAZolam (XANAX) 1 MG tablet Take 1 tablet (1 mg total) by mouth 4 (four) times daily as needed for anxiety (panic attack). 120 tablet 2   No current facility-administered medications for this visit.    Medication Side Effects: None  Allergies:  Allergies  Allergen Reactions  . Pollen Extract     Past Medical History:  Diagnosis Date  . ADHD (attention deficit hyperactivity disorder)   . Allergic rhinitis   . Anxiety   . Back pain   . Depression   . Fatigue   . Headache   . Migraine   . Obesity (BMI 30.0-34.9)     Family History  Problem Relation Age of Onset  . Diabetes Mother   . Hypertension Mother   . Migraines Mother   . Diabetes Maternal Grandmother   . Bipolar disorder Father   . Heart attack Father     Social History   Socioeconomic History  . Marital status: Single    Spouse name: Not on file  . Number of children: 0  . Years of education: Not on file  . Highest education level: High school graduate  Occupational History  . Not on file  Tobacco Use  . Smoking status: Never Smoker  . Smokeless tobacco: Never Used  Vaping Use  . Vaping Use: Never used  Substance and Sexual Activity  . Alcohol use: Not Currently  . Drug use: Not Currently    Types: Marijuana    Comment: smoked weed in the past  . Sexual activity: Not on file  Other Topics Concern  . Not on file  Social History  Narrative   Lives at home with his mother   Right handed   Caffeine: 1 cup 2-3 times a week   While attending high school at [employer], the death of father in 2019/11/09apparently from heart attack though having bipolar disorder apparently with multiple medications as a loss also confusing family including patient as mother subsequently in January 2020 memorialized by a trip to [place] of 10 days which was highly stressful to the patient for separation anxiety was accomplished without any additional known consequence.  The patient's name of Seven may represent  additional memorialization patient doing much better inpatient socially than mother expected attending groups when she never thought that would happen.  The only medications that have helped are Ativan 0.5 mg and trazodone 50 mg as mother considers patient has outgrown ADHD both consider that he is not at all depressed but grieving and anxious.  He has therefore stopped all medications but continues psychotherapy with Dr. Dellia Cloud in which he finds value.  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  He denies any current use of cannabis though using in the past.  Mother and patient seek practical relief for his symptoms as he wants to get on with family and music production lives.   Social Determinants of Health   Financial Resource Strain: Not on file  Food Insecurity: Not on file  Transportation Needs: Not on file  Physical Activity: Not on file  Stress: Not on file  Social Connections: Not on file  Intimate Partner Violence: Not on file    Past Medical History, Surgical history, Social history, and Family history were reviewed and updated as appropriate.   Please see review of systems for further details on the patient's review from today.   Objective:   Physical Exam:  There were no vitals taken for this visit.  Physical Exam Constitutional:      General: He is not in acute distress. Musculoskeletal:        General: No deformity.  Neurological:     Mental Status: He is alert and oriented to person, place, and time.     Coordination: Coordination normal.  Psychiatric:        Attention and Perception: Attention and perception normal. He does not perceive auditory or visual hallucinations.        Mood and Affect: Mood normal. Mood is not anxious or depressed. Affect is not labile, blunt, angry or inappropriate.        Speech: Speech normal.        Behavior: Behavior normal.        Thought Content: Thought content normal. Thought  content is not paranoid or delusional. Thought content does not include homicidal or suicidal ideation. Thought content does not include homicidal or suicidal plan.        Cognition and Memory: Cognition and memory normal.        Judgment: Judgment normal.     Comments: Insight intact     Lab Review:     Component Value Date/Time   NA 140 10/28/2018 0627   NA 142 06/13/2018 1533   K 4.1 10/28/2018 0627   CL 104 10/28/2018 0627   CO2 27 10/28/2018 0627   GLUCOSE 75 10/28/2018 0627   BUN 11 10/28/2018 0627   BUN 15 06/13/2018 1533   CREATININE 1.05 10/28/2018 0627   CALCIUM 9.2 10/28/2018 0627   PROT 6.9 06/13/2018 1533   ALBUMIN 4.4 06/13/2018  1533   AST 27 06/13/2018 1533   ALT 29 06/13/2018 1533   ALKPHOS 88 06/13/2018 1533   BILITOT 0.2 06/13/2018 1533   GFRNONAA >60 10/28/2018 0627   GFRAA >60 10/28/2018 0627       Component Value Date/Time   WBC 4.5 10/28/2018 0627   RBC 5.49 10/28/2018 0627   HGB 14.3 10/28/2018 0627   HGB 13.8 06/13/2018 1533   HCT 46.6 10/28/2018 0627   HCT 40.4 06/13/2018 1533   PLT 284 10/28/2018 0627   PLT 283 06/13/2018 1533   MCV 84.9 10/28/2018 0627   MCV 77 (L) 06/13/2018 1533   MCH 26.0 10/28/2018 0627   MCHC 30.7 10/28/2018 0627   RDW 14.0 10/28/2018 0627   RDW 13.3 06/13/2018 1533   LYMPHSABS 2.1 10/28/2018 0627   MONOABS 0.3 10/28/2018 0627   EOSABS 0.3 10/28/2018 0627   BASOSABS 0.0 10/28/2018 0627    No results found for: POCLITH, LITHIUM   No results found for: PHENYTOIN, PHENOBARB, VALPROATE, CBMZ   .res Assessment: Plan:    Plan:  PDMP reviewed  1. Xanax 1mg  up to 4 times a day  Read and reviewed note with patient for accuracy.   RTC 4 weeks  Patient advised to contact office with any questions, adverse effects, or acute worsening in signs and symptoms.  Discussed potential benefits, risk, and side effects of benzodiazepines to include potential risk of tolerance and dependence, as well as possible  drowsiness.  Advised patient not to drive if experiencing drowsiness and to take lowest possible effective dose to minimize risk of dependence and tolerance.    Diagnoses and all orders for this visit:  Panic disorder  Social anxiety disorder  Major depressive disorder, recurrent episode, moderate (HCC)     Please see After Visit Summary for patient specific instructions.  Future Appointments  Date Time Provider Department Center  02/07/2020  4:20 PM Dannya Pitkin, 02/09/2020, NP [facility] None    No orders of the defined types were placed in this encounter.   -------------------------------

## 2020-01-06 ENCOUNTER — Emergency Department (HOSPITAL_COMMUNITY): Payer: BC Managed Care – PPO

## 2020-01-06 ENCOUNTER — Other Ambulatory Visit: Payer: Self-pay

## 2020-01-06 ENCOUNTER — Encounter (HOSPITAL_COMMUNITY): Payer: Self-pay | Admitting: Emergency Medicine

## 2020-01-06 ENCOUNTER — Emergency Department (HOSPITAL_COMMUNITY)
Admission: EM | Admit: 2020-01-06 | Discharge: 2020-01-06 | Disposition: A | Discharge status: Home / Self Care | Payer: BC Managed Care – PPO | Attending: Emergency Medicine | Admitting: Emergency Medicine

## 2020-01-06 DIAGNOSIS — R1033 Periumbilical pain: Secondary | ICD-10-CM | POA: Diagnosis not present

## 2020-01-06 DIAGNOSIS — U071 COVID-19: Secondary | ICD-10-CM | POA: Diagnosis not present

## 2020-01-06 DIAGNOSIS — R109 Unspecified abdominal pain: Secondary | ICD-10-CM | POA: Diagnosis not present

## 2020-01-06 DIAGNOSIS — R112 Nausea with vomiting, unspecified: Secondary | ICD-10-CM | POA: Insufficient documentation

## 2020-01-06 DIAGNOSIS — R059 Cough, unspecified: Secondary | ICD-10-CM | POA: Diagnosis not present

## 2020-01-06 DIAGNOSIS — R000 Tachycardia, unspecified: Secondary | ICD-10-CM | POA: Insufficient documentation

## 2020-01-06 LAB — RESP PANEL BY RT-PCR (FLU A&B, COVID) ARPGX2
Influenza A by PCR: NEGATIVE
Influenza B by PCR: NEGATIVE
SARS Coronavirus 2 by RT PCR: POSITIVE — AB

## 2020-01-06 LAB — URINALYSIS, ROUTINE W REFLEX MICROSCOPIC
Bilirubin Urine: NEGATIVE
Glucose, UA: NEGATIVE mg/dL
Hgb urine dipstick: NEGATIVE
Ketones, ur: 80 mg/dL — AB
Leukocytes,Ua: NEGATIVE
Nitrite: NEGATIVE
Protein, ur: NEGATIVE mg/dL
Specific Gravity, Urine: 1.043 — ABNORMAL HIGH (ref 1.005–1.030)
pH: 7 (ref 5.0–8.0)

## 2020-01-06 LAB — CBC WITH DIFFERENTIAL/PLATELET
Abs Immature Granulocytes: 0.02 10*3/uL (ref 0.00–0.07)
Basophils Absolute: 0 10*3/uL (ref 0.0–0.1)
Basophils Relative: 1 %
Eosinophils Absolute: 0.1 10*3/uL (ref 0.0–0.5)
Eosinophils Relative: 1 %
HCT: 41.4 % (ref 39.0–52.0)
Hemoglobin: 13.5 g/dL (ref 13.0–17.0)
Immature Granulocytes: 0 %
Lymphocytes Relative: 5 %
Lymphs Abs: 0.5 10*3/uL — ABNORMAL LOW (ref 0.7–4.0)
MCH: 26.9 pg (ref 26.0–34.0)
MCHC: 32.6 g/dL (ref 30.0–36.0)
MCV: 82.5 fL (ref 80.0–100.0)
Monocytes Absolute: 0.8 10*3/uL (ref 0.1–1.0)
Monocytes Relative: 10 %
Neutro Abs: 6.9 10*3/uL (ref 1.7–7.7)
Neutrophils Relative %: 83 %
Platelets: 209 10*3/uL (ref 150–400)
RBC: 5.02 MIL/uL (ref 4.22–5.81)
RDW: 13.9 % (ref 11.5–15.5)
WBC: 8.3 10*3/uL (ref 4.0–10.5)
nRBC: 0 % (ref 0.0–0.2)

## 2020-01-06 LAB — COMPREHENSIVE METABOLIC PANEL
ALT: 35 U/L (ref 0–44)
AST: 38 U/L (ref 15–41)
Albumin: 4.6 g/dL (ref 3.5–5.0)
Alkaline Phosphatase: 75 U/L (ref 38–126)
Anion gap: 14 (ref 5–15)
BUN: 12 mg/dL (ref 6–20)
CO2: 21 mmol/L — ABNORMAL LOW (ref 22–32)
Calcium: 9.5 mg/dL (ref 8.9–10.3)
Chloride: 103 mmol/L (ref 98–111)
Creatinine, Ser: 1.16 mg/dL (ref 0.61–1.24)
GFR, Estimated: >60 mL/min (ref >60)
Glucose, Bld: 98 mg/dL (ref 70–99)
Potassium: 3.6 mmol/L (ref 3.5–5.1)
Sodium: 138 mmol/L (ref 135–145)
Total Bilirubin: 0.7 mg/dL (ref 0.3–1.2)
Total Protein: 7.8 g/dL (ref 6.5–8.1)

## 2020-01-06 LAB — LIPASE, BLOOD: Lipase: 22 U/L (ref 11–51)

## 2020-01-06 MED ORDER — IOHEXOL 300 MG/ML  SOLN
100.0000 mL | Freq: Once | INTRAMUSCULAR | Status: AC | PRN
  Administered 2020-01-06: 08:00:00 100 mL via INTRAVENOUS

## 2020-01-06 MED ORDER — SODIUM CHLORIDE 0.9 % IV BOLUS
1000.0000 mL | Freq: Once | INTRAVENOUS | Status: AC
  Administered 2020-01-06: 07:00:00 1000 mL via INTRAVENOUS

## 2020-01-06 MED ORDER — ONDANSETRON HCL 4 MG/2ML IJ SOLN
4.0000 mg | Freq: Once | INTRAMUSCULAR | Status: AC | PRN
  Administered 2020-01-06: 07:00:00 4 mg via INTRAVENOUS
  Filled 2020-01-06: qty 2

## 2020-01-06 MED ORDER — ACETAMINOPHEN 500 MG PO TABS: 500.0000 mg | ORAL_TABLET | Freq: Four times a day (QID) | ORAL | 0 refills | Status: DC | PRN

## 2020-01-06 MED ORDER — ACETAMINOPHEN 500 MG PO TABS
1000.0000 mg | ORAL_TABLET | Freq: Once | ORAL | Status: AC
  Administered 2020-01-06: 06:00:00 1000 mg via ORAL
  Filled 2020-01-06: qty 2

## 2020-01-06 MED ORDER — ONDANSETRON HCL 4 MG PO TABS: 4.0000 mg | ORAL_TABLET | Freq: Three times a day (TID) | ORAL | 0 refills | Status: DC | PRN

## 2020-01-06 MED ORDER — DICYCLOMINE HCL 10 MG PO CAPS
10.0000 mg | ORAL_CAPSULE | Freq: Once | ORAL | Status: AC
  Administered 2020-01-06: 06:00:00 10 mg via ORAL
  Filled 2020-01-06: qty 1

## 2020-01-06 NOTE — ED Provider Notes (Signed)
[facility] Provider Note   CSN: [id number] Arrival date & time: 01/06/20  4166     History Chief Complaint  Patient presents with  . Abdominal Pain  . Constipation    Jeremiah [name] is a 19 y.o. male presenting for evaluation of abd pain, n/v.   Patient states the past few days, he has not been feeling well.  He reports nausea and vomiting, and persistent abdominal pain that feels like some days punching him in the stomach.  He reports 3-4 episodes of emesis today.  He does not know if there is blood in it, as he had spaghetti sauce.  He reports subjective fevers.  He has associated mild nasal congestion and a mild cough.  He has had decreased bowel movements, cannot around last 1, but also states decreased p.o. intake.  Normal urination.  He has not taken anything for his symptoms.  Food makes his nausea and pain worse, nothing makes it better.  No change in pain with urination, bowel movements.  No previous history of abdominal problems or surgeries.  He has no medical problems, takes no medications daily.  No sick contacts.  He has not been vaccinated for Covid or flu.  HPI     Past Medical History:  Diagnosis Date  . ADHD (attention deficit hyperactivity disorder)   . Allergic rhinitis   . Anxiety   . Back pain   . Depression   . Fatigue   . Headache   . Migraine   . Obesity (BMI 30.0-34.9)     Patient Active Problem List   Diagnosis Date Noted  . Hearing loss of both ears due to cerumen impaction 10/25/2019  . Persistent depressive disorder with atypical features, currently mild 03/22/2019  . Social anxiety disorder 02/07/2019  . Attention deficit hyperactivity disorder (ADHD), combined type, moderate 11/24/2018  . Generalized anxiety disorder 11/24/2018  . Panic disorder 10/27/2018  . Migraine with aura and without status migrainosus, not intractable 06/14/2018    Past Surgical History:  Procedure Laterality Date  . NO PAST  SURGERIES         Family History  Problem Relation Age of Onset  . Diabetes Mother   . Hypertension Mother   . Migraines Mother   . Diabetes Maternal Grandmother   . Bipolar disorder Father   . Heart attack Father     Social History   Tobacco Use  . Smoking status: Never Smoker  . Smokeless tobacco: Never Used  Vaping Use  . Vaping Use: Never used  Substance Use Topics  . Alcohol use: Not Currently  . Drug use: Not Currently    Types: Marijuana    Comment: smoked weed in the past    Home Medications Prior to Admission medications   Medication Sig Start Date End Date Taking? Authorizing Provider  ALPRAZolam Prudy Feeler) 1 MG tablet Take 1 tablet (1 mg total) by mouth 4 (four) times daily as needed for anxiety (panic attack). 09/27/19   Chauncey Mann, MD    Allergies    Pollen extract  Review of Systems   Review of Systems  Constitutional: Positive for fever. Unexpected weight change: subjective.  HENT: Positive for congestion.   Respiratory: Positive for cough.   Gastrointestinal: Positive for abdominal pain, nausea and vomiting.  All other systems reviewed and are negative.   Physical Exam Updated Vital Signs BP (!) 144/117 (BP Location: Left Arm)   Pulse (!) 116   Temp 98.3 F (36.8 C) (Oral)  Resp (!) 24   SpO2 100%   Physical Exam Vitals and nursing note reviewed.  Constitutional:      General: He is not in acute distress.    Appearance: He is well-developed and well-nourished.     Comments: Appears nontoxic  HENT:     Head: Normocephalic and atraumatic.  Eyes:     Extraocular Movements: Extraocular movements intact and EOM normal.     Conjunctiva/sclera: Conjunctivae normal.     Pupils: Pupils are equal, round, and reactive to light.  Cardiovascular:     Rate and Rhythm: Regular rhythm. Tachycardia present.     Pulses: Normal pulses and intact distal pulses.     Comments: Tachycardic around 115 Pulmonary:     Effort: Pulmonary effort is  normal. No respiratory distress.     Breath sounds: Normal breath sounds. No wheezing.     Comments: Speaking in full sentences. Clear lung sounds in all fields. SpO2 stable Abdominal:     General: There is no distension.     Palpations: Abdomen is soft. There is no mass.     Tenderness: There is abdominal tenderness. There is no guarding or rebound.     Comments: Periumbilical ttp. No rigidity, guarding, distention. Negative rebound  Musculoskeletal:        General: Normal range of motion.     Cervical back: Normal range of motion and neck supple.  Skin:    General: Skin is warm and dry.     Capillary Refill: Capillary refill takes less than 2 seconds.  Neurological:     Mental Status: He is alert and oriented to person, place, and time.  Psychiatric:        Mood and Affect: Mood and affect normal.     ED Results / Procedures / Treatments   Labs (all labs ordered are listed, but only abnormal results are displayed) Labs Reviewed  RESP PANEL BY RT-PCR (FLU A&B, COVID) ARPGX2  LIPASE, BLOOD  COMPREHENSIVE METABOLIC PANEL  URINALYSIS, ROUTINE W REFLEX MICROSCOPIC  CBC WITH DIFFERENTIAL/PLATELET    EKG None  Radiology No results found.  Procedures Procedures (including critical care time)  Medications Ordered in ED Medications  ondansetron (ZOFRAN) injection 4 mg (has no administration in time range)  sodium chloride 0.9 % bolus 1,000 mL (has no administration in time range)  dicyclomine (BENTYL) capsule 10 mg (has no administration in time range)  acetaminophen (TYLENOL) tablet 1,000 mg (has no administration in time range)    ED Course  I have reviewed the triage vital signs and the nursing notes.  Pertinent labs & imaging results that were available during my care of the patient were reviewed by me and considered in my medical decision making (see chart for details).    MDM Rules/Calculators/A&P                          Pt presenting for evaluation of n/v and  abd pain. On exam, pt appears nontoxic. He is tachycardic, but also has a fever. Consider dehydration vs pain vs fever. Pt with mild URI sxs as well. Consider pna, viral illness including covid. Consider intra-abd infections including early appendicitis. Will order labs, covid/flu, cxr, ct abd/pelvis, and tx symptomatically.   Pt signed out to B Laveda Norman, PA-C for f/u on labs and CT.   Final Clinical Impression(s) / ED Diagnoses Final diagnoses:  None    Rx / DC Orders ED Discharge Orders    None  Alveria Apley, PA-C 01/06/20 6433    Devoria Albe, MD 01/06/20 [id number]

## 2020-01-06 NOTE — ED Notes (Signed)
Patient transported to CT 

## 2020-01-06 NOTE — ED Provider Notes (Signed)
Received signout at the beginning of shift, please see previous providers note for complete H&P.  This is a 19 year old male presenting with complaints of nausea vomiting diarrhea for the past 2 days as well as some abdominal discomfort and mild congestion and cough.  He has not been vaccinated for COVID-19.  His abdominal pain is more epigastric on exam.  He is found to be febrile with a temperature of 103.2, tachycardic with heart rate of 114 in tachypneic with a respiratory rate of 24.  He is currently receiving a gram of Tylenol, Bentyl, Zofran, as well as 1 L of normal saline.  He is resting comfortably.  Initial chest x-ray unremarkable, labs currently pending, and abdominal and pelvis CT scan have been ordered.  8:35 AM Patient test positive for Covid infection.  Labs are reassuring, chest x-ray without pneumonia, abdominal pelvis CT scan without acute finding.  Symptom is consistent with Covid infection.  However, patient does not meet inpatient criteria for admission.  Furthermore he does not meet criteria for monoclonal antibodies.  Will discharge home with appropriate recommendation and instruction as well as recommended to quarantine.    Jeremiah [name] was evaluated in Emergency Department on 01/06/2020 for the symptoms described in the history of present illness. He was evaluated in the context of the global COVID-19 pandemic, which necessitated consideration that the patient might be at risk for infection with the SARS-CoV-2 virus that causes COVID-19. Institutional protocols and algorithms that pertain to the evaluation of patients at risk for COVID-19 are in a state of rapid change based on information released by regulatory bodies including the CDC and federal and state organizations. These policies and algorithms were followed during the patient's care in the ED.   BP 113/66   Pulse (!) 109   Temp 99.9 F (37.7 C) (Oral)   Resp (!) 23   SpO2 97%    Results for orders placed or  performed during the hospital encounter of 01/06/20  Resp Panel by RT-PCR (Flu A&B, Covid) Nasopharyngeal Swab   Specimen: Nasopharyngeal Swab; Nasopharyngeal(NP) swabs in vial transport medium  Result Value Ref Range   SARS Coronavirus 2 by RT PCR POSITIVE (A) NEGATIVE   Influenza A by PCR NEGATIVE NEGATIVE   Influenza B by PCR NEGATIVE NEGATIVE  Lipase, blood  Result Value Ref Range   Lipase 22 11 - 51 U/L  Comprehensive metabolic panel  Result Value Ref Range   Sodium 138 135 - 145 mmol/L   Potassium 3.6 3.5 - 5.1 mmol/L   Chloride 103 98 - 111 mmol/L   CO2 21 (L) 22 - 32 mmol/L   Glucose, Bld 98 70 - 99 mg/dL   BUN 12 6 - 20 mg/dL   Creatinine, Ser 7.49 0.61 - 1.24 mg/dL   Calcium 9.5 8.9 - 44.9 mg/dL   Total Protein 7.8 6.5 - 8.1 g/dL   Albumin 4.6 3.5 - 5.0 g/dL   AST 38 15 - 41 U/L   ALT 35 0 - 44 U/L   Alkaline Phosphatase 75 38 - 126 U/L   Total Bilirubin 0.7 0.3 - 1.2 mg/dL   GFR, Estimated >67 >59 mL/min   Anion gap 14 5 - 15  CBC with Differential  Result Value Ref Range   WBC 8.3 4.0 - 10.5 K/uL   RBC 5.02 4.22 - 5.81 MIL/uL   Hemoglobin 13.5 13.0 - 17.0 g/dL   HCT 16.3 84.6 - 65.9 %   MCV 82.5 80.0 - 100.0 fL  MCH 26.9 26.0 - 34.0 pg   MCHC 32.6 30.0 - 36.0 g/dL   RDW 32.9 51.8 - 84.1 %   Platelets 209 150 - 400 K/uL   nRBC 0.0 0.0 - 0.2 %   Neutrophils Relative % 83 %   Neutro Abs 6.9 1.7 - 7.7 K/uL   Lymphocytes Relative 5 %   Lymphs Abs 0.5 (L) 0.7 - 4.0 K/uL   Monocytes Relative 10 %   Monocytes Absolute 0.8 0.1 - 1.0 K/uL   Eosinophils Relative 1 %   Eosinophils Absolute 0.1 0.0 - 0.5 K/uL   Basophils Relative 1 %   Basophils Absolute 0.0 0.0 - 0.1 K/uL   Immature Granulocytes 0 %   Abs Immature Granulocytes 0.02 0.00 - 0.07 K/uL   CT ABDOMEN PELVIS W CONTRAST  Result Date: 01/06/2020 CLINICAL DATA:  Mid abdominal pain X 4 days, fever EXAM: CT ABDOMEN AND PELVIS WITH CONTRAST TECHNIQUE: Multidetector CT imaging of the abdomen and pelvis was  performed using the standard protocol following bolus administration of intravenous contrast. Repeat scanning was performed because of patient motion during the initial acquisition. CONTRAST:  OMNIPAQUE IOHEXOL 300 MG/ML SOLN nausea/vomiting after contrast administration. COMPARISON:  None. FINDINGS: Lower chest: No acute abnormality.  Small hiatal hernia. Hepatobiliary: No focal liver abnormality is seen. No gallstones, gallbladder wall thickening, or biliary dilatation. Pancreas: Unremarkable. No pancreatic ductal dilatation or surrounding inflammatory changes. Spleen: Normal in size without focal abnormality. Adrenals/Urinary Tract: Adrenal glands are unremarkable. Kidneys are normal, without renal calculi, focal lesion, or hydronephrosis. Bladder is unremarkable. Stomach/Bowel: Stomach is nondistended. Small bowel decompressed. Normal appendix. The colon is nondilated with moderate fecal material proximally, decompressed and unremarkable distally. Vascular/Lymphatic: No significant vascular findings are present. No enlarged abdominal or pelvic lymph nodes. Reproductive: Prostate is unremarkable. Other: No ascites.  No free air. Musculoskeletal: No fracture or worrisome bone lesion. 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 IMPRESSION: 1. No acute findings. 2. Small hiatal hernia. 3. 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 Electronically Signed   By: Corlis Leak M.D.   On: 01/06/2020 08:30   DG Chest Portable 1 View  Result Date: 01/06/2020 CLINICAL DATA:  Mid upper abdominal pain which is worsening EXAM: PORTABLE CHEST 1 VIEW COMPARISON:  None. FINDINGS: Normal heart size and mediastinal contours. No acute infiltrate or edema. No effusion or pneumothorax. No acute osseous findings. No visible pneumoperitoneum. Artifact from EKG leads. IMPRESSION: Negative chest and upper abdomen. Electronically Signed   By: Marnee Spring M.D.   On: 01/06/2020 06:27       Fayrene Helper,  PA-C 01/06/20 0932    Devoria Albe, MD 01/06/20 2300

## 2020-01-06 NOTE — ED Triage Notes (Signed)
Patient presents with constipation and abdominal pain. Patient states that last bowel movement was a few days ago. Endorses N/V.

## 2020-01-06 NOTE — ED Notes (Signed)
Patient back from CT.

## 2020-01-06 NOTE — Discharge Instructions (Signed)
Recommendations for at home COVID-19 symptoms management:  Please continue isolation at home. Call [phone] to see whether you might be eligible for therapeutic antibody infusions (leave your name and they will call you back).  If have acute worsening of symptoms please go to ER/urgent care for further evaluation. Check pulse oximetry and if below 90-92% please go to ER. The following supplements MAY help:  Vitamin C 500mg twice a day and Quercetin 250-500 mg twice a day Vitamin D3 2000 - 4000 u/day B Complex vitamins Zinc 75-100 mg/day Melatonin 6-10 mg at night (the optimal dose is unknown) Aspirin 81mg/day (if no history of bleeding issues)  

## 2020-01-09 DIAGNOSIS — U071 COVID-19: Secondary | ICD-10-CM | POA: Diagnosis not present

## 2020-01-09 DIAGNOSIS — R1110 Vomiting, unspecified: Secondary | ICD-10-CM | POA: Diagnosis not present

## 2020-01-23 ENCOUNTER — Telehealth: Payer: Self-pay | Admitting: Adult Health

## 2020-01-23 ENCOUNTER — Other Ambulatory Visit: Payer: Self-pay | Admitting: Adult Health

## 2020-01-23 DIAGNOSIS — F4010 Social phobia, unspecified: Secondary | ICD-10-CM

## 2020-01-23 DIAGNOSIS — F410 Panic disorder [episodic paroxysmal anxiety] without agoraphobia: Secondary | ICD-10-CM

## 2020-01-23 DIAGNOSIS — F411 Generalized anxiety disorder: Secondary | ICD-10-CM

## 2020-01-23 MED ORDER — ALPRAZOLAM 1 MG PO TABS
1.0000 mg | ORAL_TABLET | Freq: Four times a day (QID) | ORAL | 2 refills | Status: DC | PRN
Start: 2020-01-23 — End: 2020-04-09

## 2020-01-23 NOTE — Telephone Encounter (Signed)
Script sent  

## 2020-01-23 NOTE — Telephone Encounter (Signed)
Next appt is 02/07/20. Requesting refill on Xanax called to [id number]. Phone # is [phone].

## 2020-02-07 ENCOUNTER — Ambulatory Visit: Payer: BC Managed Care – PPO | Admitting: Adult Health

## 2020-03-01 ENCOUNTER — Ambulatory Visit: Payer: BC Managed Care – PPO | Admitting: Adult Health

## 2020-03-27 ENCOUNTER — Ambulatory Visit: Payer: BC Managed Care – PPO | Admitting: Adult Health

## 2020-03-27 ENCOUNTER — Other Ambulatory Visit: Payer: Self-pay

## 2020-04-09 ENCOUNTER — Telehealth: Payer: Self-pay | Admitting: Adult Health

## 2020-04-09 ENCOUNTER — Other Ambulatory Visit: Payer: Self-pay | Admitting: Adult Health

## 2020-04-09 DIAGNOSIS — F4010 Social phobia, unspecified: Secondary | ICD-10-CM

## 2020-04-09 DIAGNOSIS — F410 Panic disorder [episodic paroxysmal anxiety] without agoraphobia: Secondary | ICD-10-CM

## 2020-04-09 DIAGNOSIS — F411 Generalized anxiety disorder: Secondary | ICD-10-CM

## 2020-04-09 MED ORDER — ALPRAZOLAM 1 MG PO TABS: 1.0000 mg | ORAL_TABLET | Freq: Four times a day (QID) | ORAL | 2 refills | Status: DC | PRN

## 2020-04-09 NOTE — Telephone Encounter (Signed)
Script sent  

## 2020-04-09 NOTE — Telephone Encounter (Signed)
[place] called in for prescription refill for Xanax 1mg . Has appt 4/14. Pharmacy [facility] [address] [place], [place]

## 2020-04-09 NOTE — Telephone Encounter (Signed)
Next apt 4/14

## 2020-04-25 ENCOUNTER — Encounter: Payer: Self-pay | Admitting: Adult Health

## 2020-04-25 ENCOUNTER — Other Ambulatory Visit: Payer: Self-pay

## 2020-04-25 ENCOUNTER — Ambulatory Visit (INDEPENDENT_AMBULATORY_CARE_PROVIDER_SITE_OTHER): Payer: BC Managed Care – PPO | Admitting: Adult Health

## 2020-04-25 DIAGNOSIS — F410 Panic disorder [episodic paroxysmal anxiety] without agoraphobia: Secondary | ICD-10-CM

## 2020-04-25 DIAGNOSIS — F411 Generalized anxiety disorder: Secondary | ICD-10-CM

## 2020-04-25 DIAGNOSIS — F4010 Social phobia, unspecified: Secondary | ICD-10-CM | POA: Diagnosis not present

## 2020-04-25 MED ORDER — ALPRAZOLAM 2 MG PO TABS: 2.0000 mg | ORAL_TABLET | Freq: Three times a day (TID) | ORAL | 2 refills | Status: DC | PRN

## 2020-04-25 NOTE — Progress Notes (Signed)
Jeremiah [name] [id number] 04/19/2000 20 y.o.  Subjective:   Patient ID:  Jeremiah [name] is a 20 y.o. (DOB Feb 06, 2000) male.  Chief Complaint: No chief complaint on file.   HPI Jeremiah [name] presents to the office today for follow-up of panic attacks, GAD and social anxiety disorder.   Describes mood today as "it could be better". Pleasant. Mood symptoms - denies depression and irritability. Stating "I'm really struggling with anxiety and panic attacks". Feels anxious "most of the time". Doesn't feel ike the Xanax is working as well as it did. Increased anxiety in social settings. Recent trip to [place] for a show and had "a very difficult time". Plans to see Zoila Shutter for therapy. Stable interest and motivation. Taking medications as prescribed.  Energy levels stable. Active, does not have a regular exercise routine.   Enjoys some usual interests and activities. Single. Has a girlfriend. Lives with mother and bunny. Spending time with family. Appetite adequate. Weight loss - 7 pounds - 230 pounds.  Sleeps better some nights than others. Stating "I have nights that I don't sleep".   Focus and concentration stable. Completing tasks. Managing aspects of household. 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 Denies SI or HI.  Denies AH or VH.  Previous medication trials: Trazadone, Cymbalta, Lexapro, Hydroxyzine   Review of Systems:  Review of Systems  Musculoskeletal: Negative for gait problem.  Neurological: Negative for tremors.  Psychiatric/Behavioral:       Please refer to HPI    Medications: I have reviewed the patient's current medications.  Current Outpatient Medications  Medication Sig Dispense Refill  . acetaminophen (TYLENOL) 500 MG tablet Take 1 tablet (500 mg total) by mouth every 6 (six) hours as needed. 30 tablet 0  . ALPRAZolam (XANAX) 2 MG tablet Take 1 tablet (2 mg total) by mouth 3 (three) times daily as needed for anxiety (panic attack). 90 tablet 2  . ondansetron (ZOFRAN) 4 MG  tablet Take 1 tablet (4 mg total) by mouth every 8 (eight) hours as needed for nausea or vomiting. 12 tablet 0   No current facility-administered medications for this visit.    Medication Side Effects: None  Allergies:  Allergies  Allergen Reactions  . Pollen Extract     Past Medical History:  Diagnosis Date  . ADHD (attention deficit hyperactivity disorder)   . Allergic rhinitis   . Anxiety   . Back pain   . Depression   . Fatigue   . Headache   . Migraine   . Obesity (BMI 30.0-34.9)     Family History  Problem Relation Age of Onset  . Diabetes Mother   . Hypertension Mother   . Migraines Mother   . Diabetes Maternal Grandmother   . Bipolar disorder Father   . Heart attack Father     Social History   Socioeconomic History  . Marital status: Single    Spouse name: Not on file  . Number of children: 0  . Years of education: Not on file  . Highest education level: High school graduate  Occupational History  . Not on file  Tobacco Use  . Smoking status: Never Smoker  . Smokeless tobacco: Never Used  Vaping Use  . Vaping Use: Never used  Substance and Sexual Activity  . Alcohol use: Not Currently  . Drug use: Not Currently    Types: Marijuana    Comment: smoked weed in the past  . Sexual activity: Not on file  Other Topics Concern  . Not  on file  Social History Narrative   Lives at home with his mother   Right handed   Caffeine: 1 cup 2-3 times a week   While attending high school at [employer], the death of father in 11-06-19apparently from heart attack though having bipolar disorder apparently with multiple medications as a loss also confusing family including patient as mother subsequently in January 2020 memorialized by a trip to [place] of 10 days which was highly stressful to the patient for separation anxiety was accomplished without any additional known consequence.  The patient's name of Daxon may represent additional memorialization  patient doing much better inpatient socially than mother expected attending groups when she never thought that would happen.  The only medications that have helped are Ativan 0.5 mg and trazodone 50 mg as mother considers patient has outgrown ADHD both consider that he is not at all depressed but grieving and anxious.  He has therefore stopped all medications but continues psychotherapy with Dr. Dellia Cloud in which he finds value.  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  He denies any current use of cannabis though using in the past.  Mother and patient seek practical relief for his symptoms as he wants to get on with family and music production lives.   Social Determinants of Health   Financial Resource Strain: Not on file  Food Insecurity: Not on file  Transportation Needs: Not on file  Physical Activity: Not on file  Stress: Not on file  Social Connections: Not on file  Intimate Partner Violence: Not on file    Past Medical History, Surgical history, Social history, and Family history were reviewed and updated as appropriate.   Please see review of systems for further details on the patient's review from today.   Objective:   Physical Exam:  There were no vitals taken for this visit.  Physical Exam Constitutional:      General: He is not in acute distress. Musculoskeletal:        General: No deformity.  Neurological:     Mental Status: He is alert and oriented to person, place, and time.     Coordination: Coordination normal.  Psychiatric:        Attention and Perception: Attention and perception normal. He does not perceive auditory or visual hallucinations.        Mood and Affect: Mood normal. Mood is not anxious or depressed. Affect is not labile, blunt, angry or inappropriate.        Speech: Speech normal.        Behavior: Behavior normal.        Thought Content: Thought content normal. Thought content is not paranoid or  delusional. Thought content does not include homicidal or suicidal ideation. Thought content does not include homicidal or suicidal plan.        Cognition and Memory: Cognition and memory normal.        Judgment: Judgment normal.     Comments: Insight intact     Lab Review:     Component Value Date/Time   NA 138 01/06/2020 0645   NA 142 06/13/2018 1533   K 3.6 01/06/2020 0645   CL 103 01/06/2020 0645   CO2 21 (L) 01/06/2020 0645   GLUCOSE 98 01/06/2020 0645   BUN 12 01/06/2020 0645   BUN 15 06/13/2018 1533   CREATININE 1.16 01/06/2020 0645   CALCIUM 9.5 01/06/2020 0645   PROT 7.8 01/06/2020  0645   PROT 6.9 06/13/2018 1533   ALBUMIN 4.6 01/06/2020 0645   ALBUMIN 4.4 06/13/2018 1533   AST 38 01/06/2020 0645   ALT 35 01/06/2020 0645   ALKPHOS 75 01/06/2020 0645   BILITOT 0.7 01/06/2020 0645   BILITOT 0.2 06/13/2018 1533   GFRNONAA >60 01/06/2020 0645   GFRAA >60 10/28/2018 0627       Component Value Date/Time   WBC 8.3 01/06/2020 0645   RBC 5.02 01/06/2020 0645   HGB 13.5 01/06/2020 0645   HGB 13.8 06/13/2018 1533   HCT 41.4 01/06/2020 0645   HCT 40.4 06/13/2018 1533   PLT 209 01/06/2020 0645   PLT 283 06/13/2018 1533   MCV 82.5 01/06/2020 0645   MCV 77 (L) 06/13/2018 1533   MCH 26.9 01/06/2020 0645   MCHC 32.6 01/06/2020 0645   RDW 13.9 01/06/2020 0645   RDW 13.3 06/13/2018 1533   LYMPHSABS 0.5 (L) 01/06/2020 0645   MONOABS 0.8 01/06/2020 0645   EOSABS 0.1 01/06/2020 0645   BASOSABS 0.0 01/06/2020 0645    No results found for: POCLITH, LITHIUM   No results found for: PHENYTOIN, PHENOBARB, VALPROATE, CBMZ   .res Assessment: Plan:     Plan:  PDMP reviewed  1. Increase Xanax 1mg  up to 4 times to 2mg  TID for increased anxiety and panic attacks.   RTC 4 weeks  Patient advised to contact office with any questions, adverse effects, or acute worsening in signs and symptoms.  Discussed potential benefits, risk, and side effects of benzodiazepines to  include potential risk of tolerance and dependence, as well as possible drowsiness.  Advised patient not to drive if experiencing drowsiness and to take lowest possible effective dose to minimize risk of dependence and tolerance.   Diagnoses and all orders for this visit:  Social anxiety disorder -     ALPRAZolam (XANAX) 2 MG tablet; Take 1 tablet (2 mg total) by mouth 3 (three) times daily as needed for anxiety (panic attack).  Panic disorder -     ALPRAZolam (XANAX) 2 MG tablet; Take 1 tablet (2 mg total) by mouth 3 (three) times daily as needed for anxiety (panic attack).  Generalized anxiety disorder -     ALPRAZolam (XANAX) 2 MG tablet; Take 1 tablet (2 mg total) by mouth 3 (three) times daily as needed for anxiety (panic attack).     Please see After Visit Summary for patient specific instructions.  Future Appointments  Date Time Provider Department Center  06/25/2020  4:40 PM Laurence Crofford, , NP [facility] None    No orders of the defined types were placed in this encounter.   -------------------------------

## 2020-04-30 DIAGNOSIS — H6123 Impacted cerumen, bilateral: Secondary | ICD-10-CM | POA: Diagnosis not present

## 2020-05-28 ENCOUNTER — Telehealth: Payer: Self-pay | Admitting: Adult Health

## 2020-05-28 NOTE — Telephone Encounter (Signed)
Please review

## 2020-05-28 NOTE — Telephone Encounter (Signed)
Mom left a message asking to speak to regina about her son Jeremiah [name]. We do have a signed consent to talk to her. She said that her son is abusing his medicine. She also would like to talk about a therapist. Please call Misty Stanley back at [phone]

## 2020-05-28 NOTE — Telephone Encounter (Signed)
Can you call and get more details?

## 2020-05-29 NOTE — Telephone Encounter (Signed)
Rtc to Mom and she reports pt is not doing well. She feels he needs to get back in counseling ASAP but she reports Kayla doesn't have anything until June. This location is near their house so it would work best. He is definitely willing to go back to therapy.  Mom reports he told Rene Kocher at last visit his Xanax wasn't working so it was increased. He had a fight with his GF recently and GF reported he took "8" , not sure on that exactly. Mom also reports she thinks he's drinking with it too and he knows he can't drink. Mom reports he's so angry all the me, and focuses on things that can't be changed. He is unable to deal with situations.   Mom wanting him to get see sooner and in with a counselor. Informed her I would call back.

## 2020-05-29 NOTE — Telephone Encounter (Signed)
Noted  

## 2020-05-31 ENCOUNTER — Ambulatory Visit (INDEPENDENT_AMBULATORY_CARE_PROVIDER_SITE_OTHER): Payer: BC Managed Care – PPO | Admitting: Adult Health

## 2020-05-31 ENCOUNTER — Other Ambulatory Visit: Payer: Self-pay

## 2020-05-31 ENCOUNTER — Encounter: Payer: Self-pay | Admitting: Adult Health

## 2020-05-31 DIAGNOSIS — F331 Major depressive disorder, recurrent, moderate: Secondary | ICD-10-CM | POA: Diagnosis not present

## 2020-05-31 DIAGNOSIS — F4010 Social phobia, unspecified: Secondary | ICD-10-CM | POA: Diagnosis not present

## 2020-05-31 DIAGNOSIS — F411 Generalized anxiety disorder: Secondary | ICD-10-CM

## 2020-05-31 DIAGNOSIS — F410 Panic disorder [episodic paroxysmal anxiety] without agoraphobia: Secondary | ICD-10-CM | POA: Diagnosis not present

## 2020-05-31 MED ORDER — ALPRAZOLAM 1 MG PO TABS
ORAL_TABLET | ORAL | 0 refills | Status: DC
Start: 2020-05-31 — End: 2020-07-05

## 2020-05-31 NOTE — Progress Notes (Signed)
Jeremiah [name] [id number] [birth year] 20 y.o.  Subjective:   Patient ID:  Jeremiah [name] is a 20 y.o. (DOB 12-04-2000) male.  Chief Complaint: No chief complaint on file.   HPI Jeremiah [name] presents to the office today for follow-up of panic attacks, GAD and social anxiety disorder.   Describes mood today as "ok". Pleasant. Mood symptoms - reports some depression and irritability. Feels anxious. Having panic attacks. Got mad last at girlfriend last week - argument over the phone. He took 5 of his Xanax at one time - "I was not trying to harm myself, just calm down. Typically takes 3 tablets when when having "bad anxiety". Feels like anxiety is typically random. Stating it is better - last year stayed anxious all the time. 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 Use to not be able to attend family functions - I would go somewhere and find a place to be by myself. Plans to see Zoila Shutter for therapy - has some things he needs to work through. Previously seen by Dr.Gutterman. Stable interest and motivation. Taking medications as prescribed - has taken extra tablets during severe panic.  Energy levels stable. Active, does not have a regular exercise routine.   Enjoys some usual interests and activities. Single. Has a girlfriend. Lives with mother and bunny. Spending time with family. Appetite adequate. Weight loss - 230 pounds.  Sleeps better some nights than others.  Focus and concentration stable. Completing tasks. Managing aspects of household. 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 Denies SI or HI.  Denies AH or VH. Consumes alcohol - occasional.  Previous medication trials: Trazadone, Cymbalta, Lexapro, Hydroxyzine    Review of Systems:  Review of Systems  Musculoskeletal: Negative for gait problem.  Neurological: Negative for tremors.  Psychiatric/Behavioral:       Please refer to HPI    Medications: I have reviewed the patient's current medications.  Current  Outpatient Medications  Medication Sig Dispense Refill  . acetaminophen (TYLENOL) 500 MG tablet Take 1 tablet (500 mg total) by mouth every 6 (six) hours as needed. 30 tablet 0  . alprazolam (XANAX) 1 MG tablet Take one tablet up to four times daily as needed for panic attacks. 120 tablet 0  . ondansetron (ZOFRAN) 4 MG tablet Take 1 tablet (4 mg total) by mouth every 8 (eight) hours as needed for nausea or vomiting. 12 tablet 0   No current facility-administered medications for this visit.    Medication Side Effects: None  Allergies:  Allergies  Allergen Reactions  . Pollen Extract     Past Medical History:  Diagnosis Date  . ADHD (attention deficit hyperactivity disorder)   . Allergic rhinitis   . Anxiety   . Back pain   . Depression   . Fatigue   . Headache   . Migraine   . Obesity (BMI 30.0-34.9)     Past Medical History, Surgical history, Social history, and Family history were reviewed and updated as appropriate.   Please see review of systems for further details on the patient's review from today.   Objective:   Physical Exam:  There were no vitals taken for this visit.  Physical Exam Neurological:     Mental Status: He is alert and oriented to person, place, and time.     Cranial Nerves: No dysarthria.  Psychiatric:        Attention and Perception: Attention and perception normal.  Mood and Affect: Mood normal.        Speech: Speech normal.        Behavior: Behavior is cooperative.        Thought Content: Thought content normal. Thought content is not paranoid or delusional. Thought content does not include homicidal or suicidal ideation. Thought content does not include homicidal or suicidal plan.        Cognition and Memory: Cognition and memory normal.        Judgment: Judgment normal.     Comments: Insight intact     Lab Review:     Component Value Date/Time   NA 138 01/06/2020 0645   NA 142 06/13/2018 1533   K 3.6 01/06/2020 0645   CL 103  01/06/2020 0645   CO2 21 (L) 01/06/2020 0645   GLUCOSE 98 01/06/2020 0645   BUN 12 01/06/2020 0645   BUN 15 06/13/2018 1533   CREATININE 1.16 01/06/2020 0645   CALCIUM 9.5 01/06/2020 0645   PROT 7.8 01/06/2020 0645   PROT 6.9 06/13/2018 1533   ALBUMIN 4.6 01/06/2020 0645   ALBUMIN 4.4 06/13/2018 1533   AST 38 01/06/2020 0645   ALT 35 01/06/2020 0645   ALKPHOS 75 01/06/2020 0645   BILITOT 0.7 01/06/2020 0645   BILITOT 0.2 06/13/2018 1533   GFRNONAA >60 01/06/2020 0645   GFRAA >60 10/28/2018 0627       Component Value Date/Time   WBC 8.3 01/06/2020 0645   RBC 5.02 01/06/2020 0645   HGB 13.5 01/06/2020 0645   HGB 13.8 06/13/2018 1533   HCT 41.4 01/06/2020 0645   HCT 40.4 06/13/2018 1533   PLT 209 01/06/2020 0645   PLT 283 06/13/2018 1533   MCV 82.5 01/06/2020 0645   MCV 77 (L) 06/13/2018 1533   MCH 26.9 01/06/2020 0645   MCHC 32.6 01/06/2020 0645   RDW 13.9 01/06/2020 0645   RDW 13.3 06/13/2018 1533   LYMPHSABS 0.5 (L) 01/06/2020 0645   MONOABS 0.8 01/06/2020 0645   EOSABS 0.1 01/06/2020 0645   BASOSABS 0.0 01/06/2020 0645    No results found for: POCLITH, LITHIUM   No results found for: PHENYTOIN, PHENOBARB, VALPROATE, CBMZ   .res Assessment: Plan:     Plan:  PDMP reviewed  1. Decrease Xanax 1mg  up to 4 times daily from 2mg  TID for increased anxiety and panic attacks. Does not take every day - uses 2 to 3 tablets at a time when having panic attacks.  Set up with therapist - .  RTC 4 weeks  Patient advised to contact office with any questions, adverse effects, or acute worsening in signs and symptoms.  Discussed potential benefits, risk, and side effects of benzodiazepines to include potential risk of tolerance and dependence, as well as possible drowsiness.  Advised patient not to drive if experiencing drowsiness and to take lowest possible effective dose to minimize risk of dependence and tolerance.   Diagnoses and all orders for this  visit:  Major depressive disorder, recurrent episode, moderate (HCC)  Social anxiety disorder -     alprazolam (XANAX) 1 MG tablet; Take one tablet up to four times daily as needed for panic attacks.  Panic disorder -     alprazolam (XANAX) 1 MG tablet; Take one tablet up to four times daily as needed for panic attacks.  Generalized anxiety disorder -     alprazolam (XANAX) 1 MG tablet; Take one tablet up to four times daily as needed for panic attacks.  Please see After Visit Summary for patient specific instructions.  Future Appointments  Date Time Provider Department Center  07/05/2020  3:00 PM Recia Sons, Thereasa Solo, NP [facility] None    No orders of the defined types were placed in this encounter.   -------------------------------

## 2020-06-04 ENCOUNTER — Ambulatory Visit: Payer: BC Managed Care – PPO | Admitting: Addiction (Substance Use Disorder)

## 2020-06-25 ENCOUNTER — Ambulatory Visit: Payer: BC Managed Care – PPO | Admitting: Adult Health

## 2020-07-05 ENCOUNTER — Ambulatory Visit (INDEPENDENT_AMBULATORY_CARE_PROVIDER_SITE_OTHER): Payer: BC Managed Care – PPO | Admitting: Adult Health

## 2020-07-05 ENCOUNTER — Encounter: Payer: Self-pay | Admitting: Adult Health

## 2020-07-05 ENCOUNTER — Other Ambulatory Visit: Payer: Self-pay

## 2020-07-05 DIAGNOSIS — F411 Generalized anxiety disorder: Secondary | ICD-10-CM

## 2020-07-05 DIAGNOSIS — F4010 Social phobia, unspecified: Secondary | ICD-10-CM

## 2020-07-05 DIAGNOSIS — F331 Major depressive disorder, recurrent, moderate: Secondary | ICD-10-CM

## 2020-07-05 DIAGNOSIS — F410 Panic disorder [episodic paroxysmal anxiety] without agoraphobia: Secondary | ICD-10-CM

## 2020-07-05 MED ORDER — ALPRAZOLAM 1 MG PO TABS: ORAL_TABLET | ORAL | 2 refills | Status: DC

## 2020-07-05 NOTE — Progress Notes (Signed)
Jeremiah [name] [id number] 22-Dec-2000 20 y.o.  Subjective:   Patient ID:  Jeremiah [name] is a 20 y.o. (DOB 10-Nov-2000) male.  Chief Complaint: No chief complaint on file.   HPI Jeremiah [name] presents to the office today for follow-up of panic attacks, MDD, GAD and social anxiety disorder.   Describes mood today as "ok". Pleasant. Mood symptoms - reports some depression and irritability. Feels anxious at times. Last panic attack - 2 weeks ago. He and girlfriend doing well. Has started a new job at [employer] - working in [employer]. 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 Using Xanax as needed for sleep and panic. Stable interest and motivation. Taking medications as prescribed. Energy levels stable. Active, does not have a regular exercise routine.   Enjoys some usual interests and activities. Single. Has a girlfriend. Lives with mother and bunny. Spending time with family. Appetite adequate. Weight loss - 230 pounds.  Sleeps better some nights than others. Averages 5 to 6 hours.  Focus and concentration stable. Completing tasks. Managing aspects of household. 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 Denies SI or HI.  Denies AH or VH. Consumes alcohol - none recently.  Previous medication trials: Trazadone, Cymbalta, Lexapro, Hydroxyzine      Review of Systems:  Review of Systems  Musculoskeletal:  Negative for gait problem.  Neurological:  Negative for tremors.  Psychiatric/Behavioral:         Please refer to HPI   Medications: I have reviewed the patient's current medications.  Current Outpatient Medications  Medication Sig Dispense Refill   acetaminophen (TYLENOL) 500 MG tablet Take 1 tablet (500 mg total) by mouth every 6 (six) hours as needed. 30 tablet 0   ALPRAZolam (XANAX) 1 MG tablet Take one tablet up to four times daily as needed for panic attacks. 120 tablet 2   ondansetron (ZOFRAN) 4 MG tablet Take 1 tablet (4 mg total) by mouth every 8 (eight)  hours as needed for nausea or vomiting. 12 tablet 0   No current facility-administered medications for this visit.    Medication Side Effects: None  Allergies:  Allergies  Allergen Reactions   Pollen Extract     Past Medical History:  Diagnosis Date   ADHD (attention deficit hyperactivity disorder)    Allergic rhinitis    Anxiety    Back pain    Depression    Fatigue    Headache    Migraine    Obesity (BMI 30.0-34.9)     Past Medical History, Surgical history, Social history, and Family history were reviewed and updated as appropriate.   Please see review of systems for further details on the patient's review from today.   Objective:   Physical Exam:  There were no vitals taken for this visit.  Physical Exam Constitutional:      General: He is not in acute distress. Musculoskeletal:        General: No deformity.  Neurological:     Mental Status: He is alert and oriented to person, place, and time.     Coordination: Coordination normal.  Psychiatric:        Attention and Perception: Attention and perception normal. He does not perceive auditory or visual hallucinations.        Mood and Affect: Mood normal. Mood is not anxious or depressed. Affect is not labile, blunt, angry or inappropriate.        Speech: Speech normal.  Behavior: Behavior normal.        Thought Content: Thought content normal. Thought content is not paranoid or delusional. Thought content does not include homicidal or suicidal ideation. Thought content does not include homicidal or suicidal plan.        Cognition and Memory: Cognition and memory normal.        Judgment: Judgment normal.     Comments: Insight intact    Lab Review:     Component Value Date/Time   NA 138 01/06/2020 0645   NA 142 06/13/2018 1533   K 3.6 01/06/2020 0645   CL 103 01/06/2020 0645   CO2 21 (L) 01/06/2020 0645   GLUCOSE 98 01/06/2020 0645   BUN 12 01/06/2020 0645   BUN 15 06/13/2018 1533   CREATININE  1.16 01/06/2020 0645   CALCIUM 9.5 01/06/2020 0645   PROT 7.8 01/06/2020 0645   PROT 6.9 06/13/2018 1533   ALBUMIN 4.6 01/06/2020 0645   ALBUMIN 4.4 06/13/2018 1533   AST 38 01/06/2020 0645   ALT 35 01/06/2020 0645   ALKPHOS 75 01/06/2020 0645   BILITOT 0.7 01/06/2020 0645   BILITOT 0.2 06/13/2018 1533   GFRNONAA >60 01/06/2020 0645   GFRAA >60 10/28/2018 0627       Component Value Date/Time   WBC 8.3 01/06/2020 0645   RBC 5.02 01/06/2020 0645   HGB 13.5 01/06/2020 0645   HGB 13.8 06/13/2018 1533   HCT 41.4 01/06/2020 0645   HCT 40.4 06/13/2018 1533   PLT 209 01/06/2020 0645   PLT 283 06/13/2018 1533   MCV 82.5 01/06/2020 0645   MCV 77 (L) 06/13/2018 1533   MCH 26.9 01/06/2020 0645   MCHC 32.6 01/06/2020 0645   RDW 13.9 01/06/2020 0645   RDW 13.3 06/13/2018 1533   LYMPHSABS 0.5 (L) 01/06/2020 0645   MONOABS 0.8 01/06/2020 0645   EOSABS 0.1 01/06/2020 0645   BASOSABS 0.0 01/06/2020 0645    No results found for: POCLITH, LITHIUM   No results found for: PHENYTOIN, PHENOBARB, VALPROATE, CBMZ   .res Assessment: Plan:    Plan:  PDMP reviewed  1. Continue Xanax 1mg  up to 4 times daily   RTC 4 weeks  Patient advised to contact office with any questions, adverse effects, or acute worsening in signs and symptoms.  Discussed potential benefits, risk, and side effects of benzodiazepines to include potential risk of tolerance and dependence, as well as possible drowsiness.  Advised patient not to drive if experiencing drowsiness and to take lowest possible effective dose to minimize risk of dependence and tolerance.  Diagnoses and all orders for this visit:  Major depressive disorder, recurrent episode, moderate (HCC)  Social anxiety disorder -     ALPRAZolam (XANAX) 1 MG tablet; Take one tablet up to four times daily as needed for panic attacks.  Panic disorder -     ALPRAZolam (XANAX) 1 MG tablet; Take one tablet up to four times daily as needed for panic  attacks.  Generalized anxiety disorder -     ALPRAZolam (XANAX) 1 MG tablet; Take one tablet up to four times daily as needed for panic attacks.    Please see After Visit Summary for patient specific instructions.  Future Appointments  Date Time Provider Department Center  10/04/2020  3:20 PM Zachary Nole, 10/06/2020, NP [facility] None    No orders of the defined types were placed in this encounter.   -------------------------------

## 2020-08-06 ENCOUNTER — Ambulatory Visit: Payer: BC Managed Care – PPO | Admitting: Addiction (Substance Use Disorder)

## 2020-09-02 DIAGNOSIS — F32A Depression, unspecified: Secondary | ICD-10-CM | POA: Diagnosis not present

## 2020-09-09 DIAGNOSIS — F32A Depression, unspecified: Secondary | ICD-10-CM | POA: Diagnosis not present

## 2020-09-17 DIAGNOSIS — F32A Depression, unspecified: Secondary | ICD-10-CM | POA: Diagnosis not present

## 2020-09-24 DIAGNOSIS — F32A Depression, unspecified: Secondary | ICD-10-CM | POA: Diagnosis not present

## 2020-09-27 DIAGNOSIS — G43909 Migraine, unspecified, not intractable, without status migrainosus: Secondary | ICD-10-CM | POA: Diagnosis not present

## 2020-10-04 ENCOUNTER — Ambulatory Visit (INDEPENDENT_AMBULATORY_CARE_PROVIDER_SITE_OTHER): Payer: BC Managed Care – PPO | Admitting: Adult Health

## 2020-10-04 ENCOUNTER — Encounter: Payer: Self-pay | Admitting: Adult Health

## 2020-10-04 ENCOUNTER — Other Ambulatory Visit: Payer: Self-pay

## 2020-10-04 DIAGNOSIS — F411 Generalized anxiety disorder: Secondary | ICD-10-CM

## 2020-10-04 DIAGNOSIS — F4010 Social phobia, unspecified: Secondary | ICD-10-CM

## 2020-10-04 DIAGNOSIS — F410 Panic disorder [episodic paroxysmal anxiety] without agoraphobia: Secondary | ICD-10-CM | POA: Diagnosis not present

## 2020-10-04 DIAGNOSIS — F902 Attention-deficit hyperactivity disorder, combined type: Secondary | ICD-10-CM | POA: Diagnosis not present

## 2020-10-04 MED ORDER — DEXMETHYLPHENIDATE HCL ER 15 MG PO CP24
15.0000 mg | ORAL_CAPSULE | Freq: Every day | ORAL | 0 refills | Status: DC
Start: 2020-10-04 — End: 2021-02-05

## 2020-10-04 MED ORDER — ALPRAZOLAM 1 MG PO TABS: ORAL_TABLET | ORAL | 2 refills | Status: DC

## 2020-10-04 NOTE — Progress Notes (Signed)
Jeremiah [name] [id number] 23-Aug-2000 20 y.o.  Subjective:   Patient ID:  Jeremiah [name] is a 20 y.o. (DOB 08-01-2000) male.  Chief Complaint: No chief complaint on file.   HPI  Accompanied by mother.  Rolan Wrightsman presents to the office today for follow-up of panic attacks, MDD, GAD, ADD and social anxiety disorder.   Describes mood today as "ok". Pleasant. Mood symptoms - reports some depression and irritability. Feels anxious at times. Denies panic attacks. Stating "I'm ok for now". Using Xanax as needed - "not taking everyday". Plans to return to school next week for photography and is wanting to restart Focalin for ADHD symptoms. Has taken previously and it works well for getting school work completed. Stable interest and motivation. Taking medications as prescribed. Seeing therapist - "going well". Energy levels stable. Active, does not have a regular exercise routine.   Enjoys some usual interests and activities. Single. Has a girlfriend. Lives with mother and bunny. Spending time with family. Appetite adequate. Weight loss - 230 - 240 pounds.  Sleep increased. Staying up all night and sleeping up to 15 hours. Plans to get back on a "normal" schedule when returning to school. Focus and concentration stable. Completing tasks. Managing aspects of household. 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 Denies SI or HI.  Denies AH or VH. Consumes alcohol - none recently.  Recent physical with PCP  Previous medication trials: Trazadone, Cymbalta, Lexapro, Hydroxyzi AIMS    Flowsheet Row Admission (Discharged) [facility]  AIMS Total Score 0      AUDIT    Flowsheet Row Admission (Discharged) [facility]  Alcohol Use Disorder Identification Test Final Score (AUDIT) 0      Flowsheet Row Admission (Discharged) from  [facility]  C-SSRS RISK CATEGORY No Risk        Review of Systems:  Review of Systems  Musculoskeletal:  Negative for gait problem.  Neurological:  Negative for tremors.  Psychiatric/Behavioral:         Please refer to HPI   Medications: I have reviewed the patient's current medications.  Current Outpatient Medications  Medication Sig Dispense Refill   dexmethylphenidate (FOCALIN XR) 15 MG 24 hr capsule Take 1 capsule (15 mg total) by mouth daily. 30 capsule 0   acetaminophen (TYLENOL) 500 MG tablet Take 1 tablet (500 mg total) by mouth every 6 (six) hours as needed. 30 tablet 0   ALPRAZolam (XANAX) 1 MG tablet Take one tablet up to four times daily as needed for panic attacks. 120 tablet 2   ondansetron (ZOFRAN) 4 MG tablet Take 1 tablet (4 mg total) by mouth every 8 (eight) hours as needed for nausea or vomiting. 12 tablet 0   No current facility-administered medications for this visit.    Medication Side Effects: None  Allergies:  Allergies  Allergen Reactions   Pollen Extract     Past Medical History:  Diagnosis Date   ADHD (attention deficit hyperactivity disorder)    Allergic rhinitis    Anxiety    Back pain    Depression    Fatigue    Headache    Migraine    Obesity (BMI 30.0-34.9)     Past Medical History, Surgical history, Social history, and Family history were reviewed and updated as appropriate.   Please  see review of systems for further details on the patient's review from today.   Objective:   Physical Exam:  There were no vitals taken for this visit.  Physical Exam Constitutional:      General: He is not in acute distress. Musculoskeletal:        General: No deformity.  Neurological:     Mental Status: He is alert and oriented to person, place, and time.     Coordination: Coordination normal.  Psychiatric:        Attention and Perception: Attention and perception normal. He  does not perceive auditory or visual hallucinations.        Mood and Affect: Mood normal. Mood is not anxious or depressed. Affect is not labile, blunt, angry or inappropriate.        Speech: Speech normal.        Behavior: Behavior normal.        Thought Content: Thought content normal. Thought content is not paranoid or delusional. Thought content does not include homicidal or suicidal ideation. Thought content does not include homicidal or suicidal plan.        Cognition and Memory: Cognition and memory normal.        Judgment: Judgment normal.     Comments: Insight intact    Lab Review:     Component Value Date/Time   NA 138 01/06/2020 0645   NA 142 06/13/2018 1533   K 3.6 01/06/2020 0645   CL 103 01/06/2020 0645   CO2 21 (L) 01/06/2020 0645   GLUCOSE 98 01/06/2020 0645   BUN 12 01/06/2020 0645   BUN 15 06/13/2018 1533   CREATININE 1.16 01/06/2020 0645   CALCIUM 9.5 01/06/2020 0645   PROT 7.8 01/06/2020 0645   PROT 6.9 06/13/2018 1533   ALBUMIN 4.6 01/06/2020 0645   ALBUMIN 4.4 06/13/2018 1533   AST 38 01/06/2020 0645   ALT 35 01/06/2020 0645   ALKPHOS 75 01/06/2020 0645   BILITOT 0.7 01/06/2020 0645   BILITOT 0.2 06/13/2018 1533   GFRNONAA >60 01/06/2020 0645   GFRAA >60 10/28/2018 0627       Component Value Date/Time   WBC 8.3 01/06/2020 0645   RBC 5.02 01/06/2020 0645   HGB 13.5 01/06/2020 0645   HGB 13.8 06/13/2018 1533   HCT 41.4 01/06/2020 0645   HCT 40.4 06/13/2018 1533   PLT 209 01/06/2020 0645   PLT 283 06/13/2018 1533   MCV 82.5 01/06/2020 0645   MCV 77 (L) 06/13/2018 1533   MCH 26.9 01/06/2020 0645   MCHC 32.6 01/06/2020 0645   RDW 13.9 01/06/2020 0645   RDW 13.3 06/13/2018 1533   LYMPHSABS 0.5 (L) 01/06/2020 0645   MONOABS 0.8 01/06/2020 0645   EOSABS 0.1 01/06/2020 0645   BASOSABS 0.0 01/06/2020 0645    No results found for: POCLITH, LITHIUM   No results found for: PHENYTOIN, PHENOBARB, VALPROATE, CBMZ   .res Assessment: Plan:     Plan:  PDMP reviewed  1. Continue Xanax 1mg  up to 4 times daily  2. Add Focalin XR 15mg  daily  RTC 4 weeks  Patient advised to contact office with any questions, adverse effects, or acute worsening in signs and symptoms.  Discussed potential benefits, risk, and side effects of benzodiazepines to include potential risk of tolerance and dependence, as well as possible drowsiness.  Advised patient not to drive if experiencing drowsiness and to take lowest possible effective dose to minimize risk of dependence and tolerance.  Discussed potential benefits, risks,  and side effects of stimulants with patient to include increased heart rate, palpitations, insomnia, increased anxiety, increased irritability, or decreased appetite.  Instructed patient to contact office if experiencing any significant tolerability issues.    Diagnoses and all orders for this visit:  Attention deficit hyperactivity disorder (ADHD), combined type, moderate -     dexmethylphenidate (FOCALIN XR) 15 MG 24 hr capsule; Take 1 capsule (15 mg total) by mouth daily.  Social anxiety disorder -     ALPRAZolam (XANAX) 1 MG tablet; Take one tablet up to four times daily as needed for panic attacks.  Panic disorder -     ALPRAZolam (XANAX) 1 MG tablet; Take one tablet up to four times daily as needed for panic attacks.  Generalized anxiety disorder -     ALPRAZolam (XANAX) 1 MG tablet; Take one tablet up to four times daily as needed for panic attacks.    Please see After Visit Summary for patient specific instructions.  Future Appointments  Date Time Provider Department Center  01/10/2021  3:40 PM Trei Schoch, Thereasa Solo, NP [facility] None    No orders of the defined types were placed in this encounter.   -------------------------------

## 2020-10-09 DIAGNOSIS — F32A Depression, unspecified: Secondary | ICD-10-CM | POA: Diagnosis not present

## 2020-10-18 DIAGNOSIS — F32A Depression, unspecified: Secondary | ICD-10-CM | POA: Diagnosis not present

## 2020-10-23 DIAGNOSIS — F32A Depression, unspecified: Secondary | ICD-10-CM | POA: Diagnosis not present

## 2020-11-06 DIAGNOSIS — F32A Depression, unspecified: Secondary | ICD-10-CM | POA: Diagnosis not present

## 2020-11-13 DIAGNOSIS — F32A Depression, unspecified: Secondary | ICD-10-CM | POA: Diagnosis not present

## 2020-11-20 DIAGNOSIS — F32A Depression, unspecified: Secondary | ICD-10-CM | POA: Diagnosis not present

## 2020-12-04 DIAGNOSIS — H6123 Impacted cerumen, bilateral: Secondary | ICD-10-CM | POA: Diagnosis not present

## 2020-12-11 DIAGNOSIS — F32A Depression, unspecified: Secondary | ICD-10-CM | POA: Diagnosis not present

## 2020-12-25 DIAGNOSIS — F32A Depression, unspecified: Secondary | ICD-10-CM | POA: Diagnosis not present

## 2021-01-10 ENCOUNTER — Ambulatory Visit: Payer: BC Managed Care – PPO | Admitting: Adult Health

## 2021-01-10 NOTE — Progress Notes (Signed)
Patient no show appointment. ? ?

## 2021-01-22 DIAGNOSIS — F32A Depression, unspecified: Secondary | ICD-10-CM | POA: Diagnosis not present

## 2021-02-05 ENCOUNTER — Ambulatory Visit (INDEPENDENT_AMBULATORY_CARE_PROVIDER_SITE_OTHER): Payer: BC Managed Care – PPO | Admitting: Adult Health

## 2021-02-05 ENCOUNTER — Encounter: Payer: Self-pay | Admitting: Adult Health

## 2021-02-05 ENCOUNTER — Other Ambulatory Visit: Payer: Self-pay

## 2021-02-05 DIAGNOSIS — F4010 Social phobia, unspecified: Secondary | ICD-10-CM

## 2021-02-05 DIAGNOSIS — F411 Generalized anxiety disorder: Secondary | ICD-10-CM

## 2021-02-05 DIAGNOSIS — F410 Panic disorder [episodic paroxysmal anxiety] without agoraphobia: Secondary | ICD-10-CM

## 2021-02-05 DIAGNOSIS — F902 Attention-deficit hyperactivity disorder, combined type: Secondary | ICD-10-CM | POA: Diagnosis not present

## 2021-02-05 MED ORDER — DEXMETHYLPHENIDATE HCL ER 15 MG PO CP24: 15.0000 mg | ORAL_CAPSULE | Freq: Every day | ORAL | 0 refills | Status: DC

## 2021-02-05 MED ORDER — ALPRAZOLAM 1 MG PO TABS: ORAL_TABLET | ORAL | 2 refills | Status: DC

## 2021-02-05 NOTE — Progress Notes (Signed)
Jeremiah [name] [id number] Nov 10, 2000 21 y.o.  Subjective:   Patient ID:  Jeremiah [name] is a 21 y.o. (DOB Apr 11, 2000) male.  Chief Complaint: No chief complaint on file.   HPI Kamron Vanwyhe presents to the office today for follow-up of panic attacks, MDD, GAD, ADD and social anxiety disorder.   Describes mood today as "ok". Pleasant. Mood symptoms - denies depression, anxiety or irritability. Denies panic attacks. Stating "I'm doing good". Feels like medications are working well for him. Spending time with family and girlfriend. Planning to return to school.Stable interest and motivation. Taking medications as prescribed. Seeing therapist. Energy levels stable. Active, does not have a regular exercise routine.   Enjoys some usual interests and activities. Single. Has a girlfriend of tears. Lives with mother and Hassie Bruce - "lucky". Spending time with family. Appetite adequate. Weight gain - 253 pounds.  Sleeps well most nights. Averages 6 to 8 hours. hours.   Focus and concentration stable using Focalin as needed. Completing tasks. Managing aspects of household. Started a job at [employer]. Denies SI or HI.  Denies AH or VH. Consumes alcohol. Recent physical with PCP  Previous medication trials: Trazadone, Cymbalta, Lexapro, Hydroxyzine      Review of Systems:  Review of Systems  Musculoskeletal:  Negative for gait problem.  Neurological:  Negative for tremors.  Psychiatric/Behavioral:         Please refer to HPI   Medications: I have reviewed the patient's current medications.  Current Outpatient Medications  Medication Sig Dispense Refill   acetaminophen (TYLENOL) 500 MG tablet Take 1 tablet (500 mg total) by mouth every 6 (six) hours as needed. 30 tablet 0   ALPRAZolam (XANAX) 1 MG tablet Take one tablet up to four times daily as needed for panic attacks. 120 tablet 2   dexmethylphenidate (FOCALIN XR) 15 MG 24 hr capsule Take 1 capsule (15 mg total) by mouth daily. 30 capsule 0   ondansetron  (ZOFRAN) 4 MG tablet Take 1 tablet (4 mg total) by mouth every 8 (eight) hours as needed for nausea or vomiting. 12 tablet 0   No current facility-administered medications for this visit.    Medication Side Effects: None  Allergies:  Allergies  Allergen Reactions   Pollen Extract     Past Medical History:  Diagnosis Date   ADHD (attention deficit hyperactivity disorder)    Allergic rhinitis    Anxiety    Back pain    Depression    Fatigue    Headache    Migraine    Obesity (BMI 30.0-34.9)     Past Medical History, Surgical history, Social history, and Family history were reviewed and updated as appropriate.   Please see review of systems for further details on the patient's review from today.   Objective:   Physical Exam:  There were no vitals taken for this visit.  Physical Exam Constitutional:      General: He is not in acute distress. Musculoskeletal:        General: No deformity.  Neurological:     Mental Status: He is alert and oriented to person, place, and time.     Coordination: Coordination normal.  Psychiatric:        Attention and Perception: Attention and perception normal. He does not perceive auditory or visual hallucinations.        Mood and Affect: Mood normal. Mood is not anxious or depressed. Affect is not labile, blunt, angry or inappropriate.        Speech: Speech  normal.        Behavior: Behavior normal.        Thought Content: Thought content normal. Thought content is not paranoid or delusional. Thought content does not include homicidal or suicidal ideation. Thought content does not include homicidal or suicidal plan.        Cognition and Memory: Cognition and memory normal.        Judgment: Judgment normal.     Comments: Insight intact    Lab Review:     Component Value Date/Time   NA 138 01/06/2020 0645   NA 142 06/13/2018 1533   K 3.6 01/06/2020 0645   CL 103 01/06/2020 0645   CO2 21 (L) 01/06/2020 0645   GLUCOSE 98 01/06/2020  0645   BUN 12 01/06/2020 0645   BUN 15 06/13/2018 1533   CREATININE 1.16 01/06/2020 0645   CALCIUM 9.5 01/06/2020 0645   PROT 7.8 01/06/2020 0645   PROT 6.9 06/13/2018 1533   ALBUMIN 4.6 01/06/2020 0645   ALBUMIN 4.4 06/13/2018 1533   AST 38 01/06/2020 0645   ALT 35 01/06/2020 0645   ALKPHOS 75 01/06/2020 0645   BILITOT 0.7 01/06/2020 0645   BILITOT 0.2 06/13/2018 1533   GFRNONAA >60 01/06/2020 0645   GFRAA >60 10/28/2018 0627       Component Value Date/Time   WBC 8.3 01/06/2020 0645   RBC 5.02 01/06/2020 0645   HGB 13.5 01/06/2020 0645   HGB 13.8 06/13/2018 1533   HCT 41.4 01/06/2020 0645   HCT 40.4 06/13/2018 1533   PLT 209 01/06/2020 0645   PLT 283 06/13/2018 1533   MCV 82.5 01/06/2020 0645   MCV 77 (L) 06/13/2018 1533   MCH 26.9 01/06/2020 0645   MCHC 32.6 01/06/2020 0645   RDW 13.9 01/06/2020 0645   RDW 13.3 06/13/2018 1533   LYMPHSABS 0.5 (L) 01/06/2020 0645   MONOABS 0.8 01/06/2020 0645   EOSABS 0.1 01/06/2020 0645   BASOSABS 0.0 01/06/2020 0645    No results found for: POCLITH, LITHIUM   No results found for: PHENYTOIN, PHENOBARB, VALPROATE, CBMZ   .res Assessment: Plan:    Plan:  PDMP reviewed  1. Xanax 1mg  up to 4 times daily  2. Focalin XR 15mg  daily  RTC 3 months  Patient advised to contact office with any questions, adverse effects, or acute worsening in signs and symptoms.  Discussed potential benefits, risk, and side effects of benzodiazepines to include potential risk of tolerance and dependence, as well as possible drowsiness.  Advised patient not to drive if experiencing drowsiness and to take lowest possible effective dose to minimize risk of dependence and tolerance.  Discussed potential benefits, risks, and side effects of stimulants with patient to include increased heart rate, palpitations, insomnia, increased anxiety, increased irritability, or decreased appetite.  Instructed patient to contact office if experiencing any significant  tolerability issues.  Diagnoses and all orders for this visit:  Social anxiety disorder -     ALPRAZolam (XANAX) 1 MG tablet; Take one tablet up to four times daily as needed for panic attacks.  Panic disorder -     ALPRAZolam (XANAX) 1 MG tablet; Take one tablet up to four times daily as needed for panic attacks.  Generalized anxiety disorder -     ALPRAZolam (XANAX) 1 MG tablet; Take one tablet up to four times daily as needed for panic attacks.  Attention deficit hyperactivity disorder (ADHD), combined type, moderate -     dexmethylphenidate (FOCALIN XR) 15 MG 24 hr  capsule; Take 1 capsule (15 mg total) by mouth daily.     Please see After Visit Summary for patient specific instructions.  No future appointments.  No orders of the defined types were placed in this encounter.   -------------------------------

## 2021-02-06 DIAGNOSIS — F32A Depression, unspecified: Secondary | ICD-10-CM | POA: Diagnosis not present

## 2021-02-15 DIAGNOSIS — Z20822 Contact with and (suspected) exposure to covid-19: Secondary | ICD-10-CM | POA: Diagnosis not present

## 2021-03-01 ENCOUNTER — Other Ambulatory Visit: Payer: Self-pay | Admitting: Adult Health

## 2021-03-01 DIAGNOSIS — F4010 Social phobia, unspecified: Secondary | ICD-10-CM

## 2021-03-01 DIAGNOSIS — F411 Generalized anxiety disorder: Secondary | ICD-10-CM

## 2021-03-01 DIAGNOSIS — F410 Panic disorder [episodic paroxysmal anxiety] without agoraphobia: Secondary | ICD-10-CM

## 2021-03-18 DIAGNOSIS — B349 Viral infection, unspecified: Secondary | ICD-10-CM | POA: Diagnosis not present

## 2021-03-18 DIAGNOSIS — Z20828 Contact with and (suspected) exposure to other viral communicable diseases: Secondary | ICD-10-CM | POA: Diagnosis not present

## 2021-03-18 DIAGNOSIS — J029 Acute pharyngitis, unspecified: Secondary | ICD-10-CM | POA: Diagnosis not present

## 2021-03-25 ENCOUNTER — Other Ambulatory Visit: Payer: Self-pay | Admitting: Adult Health

## 2021-03-25 DIAGNOSIS — F411 Generalized anxiety disorder: Secondary | ICD-10-CM

## 2021-03-25 DIAGNOSIS — F410 Panic disorder [episodic paroxysmal anxiety] without agoraphobia: Secondary | ICD-10-CM

## 2021-03-25 DIAGNOSIS — F4010 Social phobia, unspecified: Secondary | ICD-10-CM

## 2021-03-25 MED ORDER — ALPRAZOLAM 1 MG PO TABS: ORAL_TABLET | ORAL | 0 refills | Status: DC

## 2021-03-25 NOTE — Telephone Encounter (Signed)
Last filled 2/20. Pt stated he lost his bottle 2 days ago and wants to get a new rx sent in

## 2021-03-25 NOTE — Telephone Encounter (Signed)
Pt called 2:47 reporting he has misplaced his Alprazolam. Starting to get very anxious. Apt 4/25. [facility] [address]Contact # [phone]. ? ?

## 2021-03-25 NOTE — Telephone Encounter (Signed)
Send in enough to last until next refill. Has he been taking consistently?

## 2021-03-26 ENCOUNTER — Emergency Department (HOSPITAL_COMMUNITY)
Admission: EM | Admit: 2021-03-26 | Discharge: 2021-03-27 | Disposition: A | Discharge status: Other Acute Inpatient Hospital | Payer: BC Managed Care – PPO | Attending: Emergency Medicine | Admitting: Emergency Medicine

## 2021-03-26 ENCOUNTER — Encounter (HOSPITAL_COMMUNITY): Payer: Self-pay

## 2021-03-26 ENCOUNTER — Other Ambulatory Visit: Payer: Self-pay

## 2021-03-26 DIAGNOSIS — R45851 Suicidal ideations: Secondary | ICD-10-CM | POA: Diagnosis not present

## 2021-03-26 DIAGNOSIS — F10129 Alcohol abuse with intoxication, unspecified: Secondary | ICD-10-CM | POA: Diagnosis not present

## 2021-03-26 DIAGNOSIS — F1010 Alcohol abuse, uncomplicated: Secondary | ICD-10-CM | POA: Insufficient documentation

## 2021-03-26 DIAGNOSIS — Y908 Blood alcohol level of 240 mg/100 ml or more: Secondary | ICD-10-CM | POA: Diagnosis not present

## 2021-03-26 DIAGNOSIS — F10929 Alcohol use, unspecified with intoxication, unspecified: Secondary | ICD-10-CM | POA: Diagnosis not present

## 2021-03-26 DIAGNOSIS — R9431 Abnormal electrocardiogram [ECG] [EKG]: Secondary | ICD-10-CM | POA: Diagnosis not present

## 2021-03-26 DIAGNOSIS — F29 Unspecified psychosis not due to a substance or known physiological condition: Secondary | ICD-10-CM | POA: Diagnosis not present

## 2021-03-26 DIAGNOSIS — Z20822 Contact with and (suspected) exposure to covid-19: Secondary | ICD-10-CM | POA: Insufficient documentation

## 2021-03-26 DIAGNOSIS — R4781 Slurred speech: Secondary | ICD-10-CM | POA: Diagnosis not present

## 2021-03-26 DIAGNOSIS — F32A Depression, unspecified: Secondary | ICD-10-CM | POA: Diagnosis not present

## 2021-03-26 LAB — CBC
HCT: 44.4 % (ref 39.0–52.0)
Hemoglobin: 14.1 g/dL (ref 13.0–17.0)
MCH: 26.4 pg (ref 26.0–34.0)
MCHC: 31.8 g/dL (ref 30.0–36.0)
MCV: 83.1 fL (ref 80.0–100.0)
Platelets: 291 10*3/uL (ref 150–400)
RBC: 5.34 MIL/uL (ref 4.22–5.81)
RDW: 14.3 % (ref 11.5–15.5)
WBC: 6.3 10*3/uL (ref 4.0–10.5)
nRBC: 0 % (ref 0.0–0.2)

## 2021-03-26 LAB — COMPREHENSIVE METABOLIC PANEL
ALT: 36 U/L (ref 0–44)
AST: 30 U/L (ref 15–41)
Albumin: 4.4 g/dL (ref 3.5–5.0)
Alkaline Phosphatase: 90 U/L (ref 38–126)
Anion gap: 6 (ref 5–15)
BUN: 10 mg/dL (ref 6–20)
CO2: 28 mmol/L (ref 22–32)
Calcium: 8.7 mg/dL — ABNORMAL LOW (ref 8.9–10.3)
Chloride: 107 mmol/L (ref 98–111)
Creatinine, Ser: 0.85 mg/dL (ref 0.61–1.24)
GFR, Estimated: >60 mL/min (ref >60)
Glucose, Bld: 104 mg/dL — ABNORMAL HIGH (ref 70–99)
Potassium: 3.9 mmol/L (ref 3.5–5.1)
Sodium: 141 mmol/L (ref 135–145)
Total Bilirubin: 0.2 mg/dL — ABNORMAL LOW (ref 0.3–1.2)
Total Protein: 7.8 g/dL (ref 6.5–8.1)

## 2021-03-26 NOTE — ED Notes (Signed)
Pt belongings are in cabinet 19- 22: black Nikes, black hoodie, black sweatpants, and light blue shirt  ?

## 2021-03-26 NOTE — ED Triage Notes (Addendum)
Pt BIB EMS from [facility]. Pt reports with SI and ETOH. Pt stopped taking his prescribed xanax 2 days ago. Pt states that he doesn't currently have a plan to hurt himself.  ? ? ?110/60 bp  ?98 room air  ?76 pulse ?2099 cbg  ?

## 2021-03-26 NOTE — ED Notes (Signed)
Pt wanded by security, belongings removed, pt dressed out.  ?

## 2021-03-27 ENCOUNTER — Ambulatory Visit (HOSPITAL_COMMUNITY)
Admission: EM | Admit: 2021-03-27 | Discharge: 2021-03-27 | Disposition: A | Discharge status: Home / Self Care | Payer: BC Managed Care – PPO | Attending: Psychiatry | Admitting: Psychiatry

## 2021-03-27 ENCOUNTER — Encounter (HOSPITAL_COMMUNITY): Payer: Self-pay

## 2021-03-27 DIAGNOSIS — F331 Major depressive disorder, recurrent, moderate: Secondary | ICD-10-CM | POA: Diagnosis not present

## 2021-03-27 DIAGNOSIS — F411 Generalized anxiety disorder: Secondary | ICD-10-CM | POA: Diagnosis not present

## 2021-03-27 LAB — RESP PANEL BY RT-PCR (FLU A&B, COVID) ARPGX2
Influenza A by PCR: NEGATIVE
Influenza B by PCR: NEGATIVE
SARS Coronavirus 2 by RT PCR: NEGATIVE

## 2021-03-27 LAB — RAPID URINE DRUG SCREEN, HOSP PERFORMED
Amphetamines: NOT DETECTED
Barbiturates: NOT DETECTED
Benzodiazepines: POSITIVE — AB
Cocaine: NOT DETECTED
Opiates: NOT DETECTED
Tetrahydrocannabinol: NOT DETECTED

## 2021-03-27 LAB — ETHANOL: Alcohol, Ethyl (B): 250 mg/dL — ABNORMAL HIGH (ref <10)

## 2021-03-27 LAB — ACETAMINOPHEN LEVEL: Acetaminophen (Tylenol), Serum: <10 ug/mL — ABNORMAL LOW (ref 10–30)

## 2021-03-27 LAB — SALICYLATE LEVEL: Salicylate Lvl: <7 mg/dL — ABNORMAL LOW (ref 7.0–30.0)

## 2021-03-27 MED ORDER — ALPRAZOLAM 0.5 MG PO TABS: 1.0000 mg | ORAL_TABLET | Freq: Three times a day (TID) | ORAL | Status: DC | PRN

## 2021-03-27 MED ORDER — LORAZEPAM 0.5 MG PO TABS
0.5000 mg | ORAL_TABLET | Freq: Once | ORAL | Status: AC
  Administered 2021-03-27: 0.5 mg via ORAL
  Filled 2021-03-27: qty 1

## 2021-03-27 MED ORDER — ONDANSETRON HCL 4 MG PO TABS: 4.0000 mg | ORAL_TABLET | Freq: Three times a day (TID) | ORAL | Status: DC | PRN

## 2021-03-27 NOTE — ED Notes (Signed)
Patient A&Ox4. Denies intent to harm self/others when asked. Denies A/VH. Patient denies any physical complaints when asked. Routine safety checks conducted according to facility protocol. Encouraged patient to notify staff if thoughts of harm toward self or others arise. Patient verbalize understanding and agreement. Will continue to monitor.  ?   ?

## 2021-03-27 NOTE — ED Provider Notes (Signed)
Behavioral Health Admission H&P ?(FBC & OBS) ? ?Date: 03/27/21 ?Patient Name: Jeremiah [name] ?MRN: [id number] ?Chief Complaint: No chief complaint on file. ?   ? ?Diagnoses:  ?Final diagnoses:  ?Moderate episode of recurrent major depressive disorder (HCC)  ?Generalized anxiety disorder  ? ? ?HPI: Jeremiah [name] 21 y.o male transfer from [facility],  c/o of severe depression,  Anxiety and SI.  According to patient he had SI yesterday but had no plans.  Denies access to firearm.   Per reported he has not taken his medication for the past 2 days.  He sees a psychiatrist at [facility] for the past 2-3 month.  Pt also saw a therapist  2 wks ago.  ? ?Per the patient he live at home with his mother Shandy Vi [phone]),  he was brought to [facility] by his mother. However patient was not fully aware of what happen tonight.  Per the patient is drinks only once or twice a month.  Pt stated he felt worthless, hopeless,  guilt for the past few days.  Currently take xanax 1 mg PRN 2-4 time daily.  Stated he was also prescribed Adderal 15 mg but does not take it, Zofran 4 mg q8h.  ? ?Pt report his mother is his support system.  Pt works full time at [employer],  get 4-8 hours sleep depending on the day. ? ?Observation of patient,  he is alert and oriented x 4,  speech coherent, maintain eye contact.  Mood depress and anxious,  denies HI, hallucination,  paranoia, illicit drug used or childhood trauma.  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  Report decrease appetite,   Pt stated "it would be best for me to stay here today,  i'm not fully functional".   ? ? ?Recommendation: inpatient observation  ? ?PHQ 2-9:   ?Flowsheet Row ED from 03/26/2021 in [facility] Admission (Discharged) from [facility]  ?C-SSRS RISK CATEGORY High Risk No Risk  ? ?  ?  ? ?Total Time spent with patient: 20 minutes ? ?Musculoskeletal  ?Strength & Muscle Tone: within  normal limits ?Gait & Station: normal ?Patient leans: N/A ? ?Psychiatric Specialty Exam  ?Presentation ?General Appearance: Appropriate for Environment ? ?Eye Contact:Good ? ?Speech:Clear and Coherent ? ?Speech Volume:Normal ? ?Handedness:Ambidextrous ? ? ?Mood and Affect  ?Mood:Anxious; Depressed; Worthless; Hopeless ? ?Affect:Appropriate ? ? ?Thought Process  ?Thought Processes:Coherent ? ?Descriptions of Associations:Circumstantial ? ?Orientation:Full (Time, Place and Person) ? ?Thought Content:Abstract Reasoning ?   ?Hallucinations:Hallucinations: None ? ?Ideas of Reference:None ? ?Suicidal Thoughts:Suicidal Thoughts: Yes, Passive ?SI Passive Intent and/or Plan: Without Plan ? ?Homicidal Thoughts:Homicidal Thoughts: No ? ? ?Sensorium  ?Memory:Immediate Good ? ?Judgment:No data recorded ?Insight:Good ? ? ?Executive Functions  ?Concentration:Good ? ?Attention Span:Good ? ?Recall:Good ? ?Fund of Knowledge:Good ? ?Language:Good ? ? ?Psychomotor Activity  ?Psychomotor Activity:No data recorded ? ?Assets  ?Assets:No data recorded ? ?Sleep  ?Sleep:Sleep: Fair ?Number of Hours of Sleep: -4 ? ? ?Nutritional Assessment (For OBS and FBC admissions only) ?Has the patient had a weight loss or gain of 10 pounds or more in the last 3 months?: No ?Has the patient had a decrease in food intake/or appetite?: Yes ?Does the patient have dental problems?: No ?Does the patient have eating habits or behaviors that may be indicators of an eating disorder including binging or inducing vomiting?: No ?Has the patient recently lost weight without trying?: 0 ?Has the patient been eating  poorly because of a decreased appetite?: 0 ?Malnutrition Screening Tool Score: 0 ? ? ? ?Physical Exam ?Constitutional:   ?   Appearance: Normal appearance.  ?HENT:  ?   Head: Normocephalic.  ?   Nose: Nose normal.  ?Eyes:  ?   Pupils: Pupils are equal, round, and reactive to light.  ?Cardiovascular:  ?   Rate and Rhythm: Normal rate.  ?Pulmonary:  ?    Effort: Pulmonary effort is normal.  ?Musculoskeletal:  ?   Cervical back: Normal range of motion.  ?Skin: ?   General: Skin is warm.  ?Neurological:  ?   General: No focal deficit present.  ?   Mental Status: He is alert.  ?Psychiatric:     ?   Mood and Affect: Mood normal.     ?   Behavior: Behavior normal.  ? ?Review of Systems  ?Constitutional: Negative.   ?HENT: Negative.    ?Eyes: Negative.   ?Respiratory: Negative.    ?Cardiovascular: Negative.   ?Gastrointestinal: Negative.   ?Genitourinary: Negative.   ?Musculoskeletal: Negative.   ?Skin: Negative.   ?Neurological: Negative.   ?Endo/Heme/Allergies: Negative.   ?Psychiatric/Behavioral:  Positive for depression and suicidal ideas. The patient is nervous/anxious.   ? ?Blood pressure 118/70, pulse 81, temperature 98 ?F (36.7 ?C), temperature source Oral, resp. rate 20, SpO2 99 %. There is no height or weight on file to calculate BMI. ? ?Past Psychiatric History: Major  Depression, GAD   ? ?Is the patient at risk to self? Yes  ?Has the patient been a risk to self in the past 6 months? Yes .    ?Has the patient been a risk to self within the distant past? No   ?Is the patient a risk to others? No   ?Has the patient been a risk to others in the past 6 months? No   ?Has the patient been a risk to others within the distant past? No  ? ?Past Medical History:  ?Past Medical History:  ?Diagnosis Date  ? ADHD (attention deficit hyperactivity disorder)   ? Allergic rhinitis   ? Anxiety   ? Back pain   ? Depression   ? Fatigue   ? Headache   ? Migraine   ? Obesity (BMI 30.0-34.9)   ?  ?Past Surgical History:  ?Procedure Laterality Date  ? NO PAST SURGERIES    ? ? ?Family History:  ?Family History  ?Problem Relation Age of Onset  ? Diabetes Mother   ? Hypertension Mother   ? Migraines Mother   ? Diabetes Maternal Grandmother   ? Bipolar disorder Father   ? Heart attack Father   ? ? ?Social History:  ?Social History  ? ?Socioeconomic History  ? Marital status: Single  ?   Spouse name: Not on file  ? Number of children: 0  ? Years of education: Not on file  ? Highest education level: High school graduate  ?Occupational History  ? Not on file  ?Tobacco Use  ? Smoking status: Never  ? Smokeless tobacco: Never  ?Vaping Use  ? Vaping Use: Never used  ?Substance and Sexual Activity  ? Alcohol use: Yes  ? Drug use: Not Currently  ?  Types: Marijuana  ?  Comment: smoked weed in the past  ? Sexual activity: Not on file  ?Other Topics Concern  ? Not on file  ?Social History Narrative  ? Lives at home with his mother  ? Right handed  ? Caffeine: 1 cup 2-3 times  a week  ? While attending high school at [employer] and [address], the death of father in Oct 30, 2019apparently from heart attack though having bipolar disorder apparently with multiple medications as a loss also confusing family including patient as mother subsequently in January 2020 memorialized by a trip to [place] of 10 days which was highly stressful to the patient for separation anxiety was accomplished without any additional known consequence.  The patient's name of Prabhjot may represent additional memorialization patient doing much better inpatient socially than mother expected attending groups when she never thought that would happen.  The only medications that have helped are Ativan 0.5 mg and trazodone 50 mg as mother considers patient has outgrown ADHD both consider that he is not at all depressed but grieving and anxious.  He has therefore stopped all medications but continues psychotherapy with Dr. Dellia Cloud in which he finds value.  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  He denies any current use of cannabis though using in the past.  Mother and patient seek practical relief for his symptoms as he wants to get on with family and music production lives.  ? ?Social Determinants of Health  ? ?Financial Resource Strain: Not on file  ?Food Insecurity: Not on file   ?Transportation Needs: Not on file  ?Physical Activity: Not on file  ?Stress: Not on file  ?Social Connections: Not on file  ?Intimate Partner Violence: Not on file  ? ? ?SDOH:  ?SDOH Screenings  ? ?Alcohol Screen: Not on fi

## 2021-03-27 NOTE — ED Provider Notes (Signed)
FBC/OBS ASAP Discharge Summary ? ?Date and Time: 03/27/2021 10:46 AM  ?Name: Jeremiah [name]  ?MRN:  [id number]  ? ?Discharge Diagnoses:  ?Final diagnoses:  ?Moderate episode of recurrent major depressive disorder ([place])  ?Generalized anxiety disorder  ? ?Subjective:  ? ?Pt is assessed face-to-face by nurse practitioner. Pt lying down, sits up on my approach. Pt appears disheveled. Pt endorses current euthymic mood. Denies current depression, anxiety, other mood disturbances. Reports hx of chronic anxiety, depression, passive SI, "dark thoughts" that "don't want to be here". Denies ever having plan or intent to act on plan. Denies current SI. Denies past hx of SA. Denies current/ever HI/VI. Reports decreased appetite, loss of 5 lbs in the past 3 months. Reports he does not remember how he arrived here last night. Last remembers drinking with his brothers at his brother's home. Reports he cannot recall how many drinks he had last night, although recalls drinking whiskey. Reports social binge drinking alcohol, estimates once/month, last drink last night. Denies drinking alone. Also reports use of marijuana last night, although states he has been "cutting down", and last use prior to last night was "weeks ago". Pt verbalizes reasons to live as his family, friends, and future goal of becoming a videographer. Pt states he is currently connected with a psychiatrist at [facility], although is planning on changing providers, last saw a month ago. Pt also in counseling with a therapist, last appointment last month, although is also looking to change therapists due to scheduling inconsistency. Pt states he has intake appointment for [facility] for medication management and counseling on 04/02/21 at [place]. Denies firearm in home/access to firearm. Denies ever engaging in NSSI. Denies AVH, paranoia, delusions. No evidence of responding to internal stimuli, AVH, paranoia, delusions, agitation or aggression. Pt gives verbal consent to speak  with his mother Jeremiah [name] Jeremiah [name]") and contact info [phone]). Denies access to a firearm. ? ?Collateral from pt's mother Jeremiah [name], [phone]). Pt has hx of chronic depression, anxiety. Reports last night pt was drinking with his brothers and expressed that family was one of his only reasons for living. Pt was brought to this facility by his mother. Pt is currently living with her, and she does not have safety concerns if pt is discharged to her home. States she can pick up pt. Per pt's mother, pt is in the process of changing psychiatrist and therapist, has appointment on 04/02/21 at [facility] at [place]. ? ?Note from 03/27/21: ? ?Jeremiah [name] 21 y.o male transfer from [facility],  c/o of severe depression,  Anxiety and SI.  According to patient he had SI yesterday but had no plans.  Denies access to firearm.   Per reported he has not taken his medication for the past 2 days.  He sees a psychiatrist at [facility] for the past 2-3 month.  Pt also saw a therapist  2 wks ago.  ?  ?Per the patient he live at home with his mother Jeremiah [name] [id number]),  he was brought to [facility] by his mother. However patient was not fully aware of what happen tonight.  Per the patient is drinks only once or twice a month.  Pt stated he felt worthless, hopeless,  guilt for the past few days.  Currently take xanax 1 mg PRN 2-4 time daily.  Stated he was also prescribed Adderal 15 mg but does not take it, Zofran 4 mg q8h.  ?  ?Pt report his mother is his support system.  Pt works full time at [employer],  get 4-8 hours sleep depending on the day. ?  ?Observation of patient,  he is alert and oriented x 4,  speech coherent, maintain eye contact.  Mood depress and anxious,  denies HI, hallucination,  paranoia, illicit drug used or childhood trauma.  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  Report decrease appetite,   Pt stated "it would be best for me to stay here today,  i'm not fully functional".   ?  ?Stay Summary:  ? ?Pt is a 21  y/o male presenting to [facility] on 03/27/21, admitted for overnight assessment. On assessment, pt reports euthymic mood, denies SI/VI/HI. Pt reports has intake appointment at [facility] for medication management and counseling on 04/02/21 at [place], confirmed by collateral pt's mother. Plan is for discharge and follow up with outpatient medication management and counseling. ? ?Total Time spent with patient: 20 minutes ? ?Past Psychiatric History: Per chart review, past psychiatric hx ADHD, Anxiety, Depression. Pt endorses 1 past psychiatric hospitalization 2-3 years.  ? ?Past Medical History:  ?Past Medical History:  ?Diagnosis Date  ? ADHD (attention deficit hyperactivity disorder)   ? Allergic rhinitis   ? Anxiety   ? Back pain   ? Depression   ? Fatigue   ? Headache   ? Migraine   ? Obesity (BMI 30.0-34.9)   ?  ?Past Surgical History:  ?Procedure Laterality Date  ? NO PAST SURGERIES    ? ?Family History:  ?Family History  ?Problem Relation Age of Onset  ? Diabetes Mother   ? Hypertension Mother   ? Migraines Mother   ? Diabetes Maternal Grandmother   ? Bipolar disorder Father   ? Heart attack Father   ? ?Family Psychiatric History: Father (bipolar disorder); Mother (depression) ? ?Social History:  ?Social History  ? ?Substance and Sexual Activity  ?Alcohol Use Yes  ?   ?Social History  ? ?Substance and Sexual Activity  ?Drug Use Not Currently  ? Types: Marijuana  ? Comment: smoked weed in the past  ?  ?Social History  ? ?Socioeconomic History  ? Marital status: Single  ?  Spouse name: Not on file  ? Number of children: 0  ? Years of education: Not on file  ? Highest education level: High school graduate  ?Occupational History  ? Not on file  ?Tobacco Use  ? Smoking status: Never  ? Smokeless tobacco: Never  ?Vaping Use  ? Vaping Use: Never used  ?Substance and Sexual Activity  ? Alcohol use: Yes  ? Drug use: Not Currently  ?  Types: Marijuana  ?  Comment: smoked weed in the past  ? Sexual activity: Not on file  ?Other Topics  Concern  ? Not on file  ?Social History Narrative  ? Lives at home with his mother  ? Right handed  ? Caffeine: 1 cup 2-3 times a week  ? While attending high school at [employer], the death of father in 11/14/2017 apparently from heart attack though having bipolar disorder apparently with multiple medications as a loss also confusing family including patient as mother subsequently in January 2020 memorialized by a trip to [place] of 10 days which was highly stressful to the patient for separation anxiety was accomplished without any additional known consequence.  The patient's name of [place] may represent additional memorialization patient doing much better inpatient socially than mother expected attending groups when she never thought that would happen.  The only medications that have helped are Ativan 0.5 mg  and trazodone 50 mg as mother considers patient has outgrown ADHD both consider that he is not at all depressed but grieving and anxious.  He has therefore stopped all medications but continues psychotherapy with Dr. Cheryln Manly in which he finds value.  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  He denies any current use of cannabis though using in the past.  Mother and patient seek practical relief for his symptoms as he wants to get on with family and music production lives.  ? ?Social Determinants of Health  ? ?Financial Resource Strain: Not on file  ?Food Insecurity: Not on file  ?Transportation Needs: Not on file  ?Physical Activity: Not on file  ?Stress: Not on file  ?Social Connections: Not on file  ? ?SDOH:  ?SDOH Screenings  ? ?Alcohol Screen: Not on file  ?Depression (PHQ2-9): Not on file  ?Financial Resource Strain: Not on file  ?Food Insecurity: Not on file  ?Housing: Not on file  ?Physical Activity: Not on file  ?Social Connections: Not on file  ?Stress: Not on file  ?Tobacco Use: Low Risk   ? Smoking Tobacco Use: Never  ? Smokeless  Tobacco Use: Never  ? Passive Exposure: Not on file  ?Transportation Needs: Not on file  ? ? ?Tobacco Cessation:  N/A, patient does not currently use tobacco products ? ?Current Medications:  ?Current Fac

## 2021-03-27 NOTE — ED Notes (Signed)
Patient A&O x 4, ambulatory. Patient discharged in no acute distress. Patient denied SI/HI, A/VH upon discharge. Patient verbalized understanding of all discharge instructions explained by staff, to include follow up appointments and safety plan. Pt belongings returned to patient from locker #29 intact. Patient escorted to lobby via staff for transport to home via his mother. Safety maintained.   ?

## 2021-03-27 NOTE — ED Notes (Signed)
RN reviewed AVS with pt. Pt verbalized understanding of recommendations for follow up care that was provided to him. Pt sitting up on bed awaiting transport to home via his mother. Informed pt to notify staff with any needs or concerns. Safety maintained. ?

## 2021-03-27 NOTE — ED Provider Notes (Signed)
?[facility] DEPT ?Provider Note ? ? ?CSN: [id number] ?Arrival date & time: 03/26/21  2306 ? ?  ? ?History ? ?Chief Complaint  ?Patient presents with  ? Alcohol Intoxication  ? Suicidal  ? ? ?Kyjuan Stolley is a 21 y.o. male  ? ? ?Alcohol Intoxication ? ?Patient is a 21 year old male with past medical history significant for anxiety depression on Xanax and seems to have run out over the past few weeks.  Seems to drink a bit of alcohol last night and does not recall events afterwards.  He states he feels well currently although somewhat anxious. ? ?Denies any suicidal plan but did endorse some suicidal ideation earlier in the night according to staff.  He denies any pain specifically no chest pain abdominal pain headache or difficulty breathing.  No other associate symptoms.  No HI no AVH.  Denies any recreational drug use ? ?  ? ?Home Medications ?Prior to Admission medications   ?Medication Sig Start Date End Date Taking? Authorizing Provider  ?acetaminophen (TYLENOL) 500 MG tablet Take 1 tablet (500 mg total) by mouth every 6 (six) hours as needed. 01/06/20   Domenic Moras, PA-C  ?ALPRAZolam (XANAX) 1 MG tablet TAKE ONE TABLET UP TO FOUR TIMES DAILY AS NEEDED FOR PANIC ATTACKS. ?Patient taking differently: Take 1 mg by mouth 4 (four) times daily as needed (for panic attacks). 03/25/21   Mozingo, Berdie Ogren, NP  ?dexmethylphenidate (FOCALIN XR) 15 MG 24 hr capsule Take 1 capsule (15 mg total) by mouth daily. ?Patient not taking: Reported on 03/27/2021 02/05/21   Mozingo, Berdie Ogren, NP  ?ondansetron (ZOFRAN) 4 MG tablet Take 1 tablet (4 mg total) by mouth every 8 (eight) hours as needed for nausea or vomiting. 01/06/20   Domenic Moras, PA-C  ?   ? ?Allergies    ?Pollen extract   ? ?Review of Systems   ?Review of Systems ? ?Physical Exam ?Updated Vital Signs ?BP 116/71   Pulse 69   Temp 97.8 ?F (36.6 ?C)   Resp 17   SpO2 98%  ?Physical Exam ?Vitals and nursing note reviewed.   ?Constitutional:   ?   General: He is not in acute distress. ?   Appearance: Normal appearance. He is not ill-appearing.  ?HENT:  ?   Head: Normocephalic and atraumatic.  ?Eyes:  ?   General: No scleral icterus.    ?   Right eye: No discharge.     ?   Left eye: No discharge.  ?   Conjunctiva/sclera: Conjunctivae normal.  ?Pulmonary:  ?   Effort: Pulmonary effort is normal.  ?   Breath sounds: No stridor.  ?Skin: ?   General: Skin is warm and dry.  ?Neurological:  ?   Mental Status: He is alert and oriented to person, place, and time. Mental status is at baseline.  ?Psychiatric:  ?   Comments: Pleasant, euthymic  ? ? ?ED Results / Procedures / Treatments   ?Labs ?(all labs ordered are listed, but only abnormal results are displayed) ?Labs Reviewed  ?COMPREHENSIVE METABOLIC PANEL - Abnormal; Notable for the following components:  ?    Result Value  ? Glucose, Bld 104 (*)   ? Calcium 8.7 (*)   ? Total Bilirubin 0.2 (*)   ? All other components within normal limits  ?ETHANOL - Abnormal; Notable for the following components:  ? Alcohol, Ethyl (B) 250 (*)   ? All other components within normal limits  ?SALICYLATE LEVEL - Abnormal; Notable for  the following components:  ? Salicylate Lvl [id number] (*)   ? All other components within normal limits  ?ACETAMINOPHEN LEVEL - Abnormal; Notable for the following components:  ? Acetaminophen (Tylenol), Serum <10 (*)   ? All other components within normal limits  ?RAPID URINE DRUG SCREEN, HOSP PERFORMED - Abnormal; Notable for the following components:  ? Benzodiazepines POSITIVE (*)   ? All other components within normal limits  ?RESP PANEL BY RT-PCR (FLU A&B, COVID) ARPGX2  ?CBC  ? ? ?EKG ?EKG Interpretation ? ?Date/Time:  Wednesday March 26 2021 23:23:33 EDT ?Ventricular Rate:  75 ?PR Interval:  153 ?QRS Duration: 99 ?QT Interval:  364 ?QTC Calculation: 407 ?R Axis:   78 ?Text Interpretation: Sinus rhythm RSR' in V1 or V2, probably normal variant Borderline T wave abnormalities  Confirmed by Quintella Reichert [phone]) on 03/27/2021 12:37:46 AM ? ?Radiology ?No results found. ? ?Procedures ?Procedures  ? ? ?Medications Ordered in ED ?Medications  ?LORazepam (ATIVAN) tablet 0.5 mg (0.5 mg Oral Given 03/27/21 0100)  ? ? ?ED Course/ Medical Decision Making/ A&P ?  ?                        ?Medical Decision Making ?Amount and/or Complexity of Data Reviewed ?Labs: ordered. ? ?Risk ?Prescription drug management. ? ? ?Patient is a 21 year old male with past medical history significant for anxiety depression on Xanax and seems to have run out over the past few weeks.  Seems to drink a bit of alcohol last night and does not recall events afterwards.  He states he feels well currently although somewhat anxious. ? ?Denies any suicidal plan but did endorse some suicidal ideation earlier in the night according to staff.  He denies any pain specifically no chest pain abdominal pain headache or difficulty breathing.  No other associate symptoms.  No HI no AVH.  Denies any recreational drug use ? ? ?Physical exam unremarkable. ? ?Tylenol salicylate level undetectably low.  Ethanol 250 but clinically seems to have metabolized significantly as he is speaking clearly and moving all 4 extremities and alert and oriented.  UDS unremarkable apart from benzos which she is prescribed.  CMP unremarkable CBC unremarkable COVID influenza negative EKG nonischemic somewhat large T waves likely LVH patient has no chest pain or other associate symptoms. ? ?Discussed with Fransico Meadow of [facility] who agrees to accept patient in transfer. ? ?Patient is medically cleared at this time transferred by safe transport.  Mother was interviewed by me to ensure that patient story lines up with hers.  It does.  She is in agreement with plan to transfer.  All questions answered to the best of my ability. ? ?Final Clinical Impression(s) / ED Diagnoses ?Final diagnoses:  ?Depression, unspecified depression type  ?Alcohol abuse  ? ? ?Rx / DC  Orders ?ED Discharge Orders   ? ? None  ? ?  ? ? ?  ?Tedd Sias, [place] ?03/27/21 [id number] ? ?  ?Quintella Reichert, MD ?03/27/21 [phone] ? ?

## 2021-03-27 NOTE — ED Notes (Signed)
EMTALA filled out. ?

## 2021-03-27 NOTE — Discharge Instructions (Addendum)
Patient is instructed prior to discharge to: ?Take all medications as prescribed by his/her mental healthcare provider. ?Report any adverse effects and or reactions from the medicines to his/her outpatient provider promptly. ?Keep all scheduled appointments, to ensure that you are getting refills on time and to avoid any interruption in your medication.  If you are unable to keep an appointment call to reschedule.  Be sure to follow-up with resources and follow-up appointments provided.  ?Patient has been instructed & cautioned: To not engage in alcohol and or illegal drug use while on prescription medicines. ?In the event of worsening symptoms, patient is instructed to call the crisis hotline, 911 and or go to the nearest ED for appropriate evaluation and treatment of symptoms. ?To follow-up with his/her primary care provider for your other medical issues, concerns and or health care needs. ? ?Pt to follow up with intake appointment scheduled at [facility] on 04/02/21. ?

## 2021-03-27 NOTE — Progress Notes (Signed)
?   03/27/21 0403  ?[facility] Triage Screening (Walk-ins at [facility] only)  ?How Did You Hear About [place]? Self  ?What Is the Reason for Your Visit/Call Today? Jeremiah [name] is a 21 year old male presenting voluntary as a transfer from [facility] to [facility] due to worsening depression and intoxication. Patient has past history of depression and anxiety. Patient reported SI with no plan. Patient denies HI. Patient reported worsening depression and anxiety this week. Patient reported he has not taking psych medications in the past 2 days. Patient reported not feeling fully functional in life. Stressors include "trying to be successful in life". Patient reported drinking alcohol heavily today, amount unknown. Patient BAL prior to being transferred was BAL 250. Patient reported varied sleep due to working 3rd shift and reports poor appetite.     Patient is currently being seen at [facility] by Jerrol Banana for medication management and has a therapist, unable to recall name. Patient reported a medication adjustment.     Patient resides with mother. Patient is currently employed at [employer] and reports no work-related stressors and enjoys going to work. Patient denied access to guns. Patient was calm and cooperative during assessment.  ?How Long Has This Been Causing You Problems? > than 6 months  ?Have You Recently Had Any Thoughts About Hurting Yourself? Yes  ?How long ago did you have thoughts about hurting yourself? yesterday  ?Are You Planning to Commit Suicide/Harm Yourself At This time? No  ?Have you Recently Had Thoughts About Hurting Someone [name]? No  ?Are You Planning To Harm Someone At This Time? No  ?Are you currently experiencing any auditory, visual or other hallucinations? No  ?Have You Used Any Alcohol or Drugs in the Past 24 Hours? Yes  ?How long ago did you use Drugs or Alcohol? alcohol  ?What Did You Use and How Much? "a lot"  ?Do you have any current medical co-morbidities that require immediate attention? No  ?Clinician  description of patient physical appearance/behavior: scrubs / cooperative  ?What Do You Feel Would Help You the Most Today? Treatment for Depression or other mood problem  ?If access to [facility] was not available, would you have sought care in the Emergency Department? Yes  ?Determination of Need Urgent (48 hours)  ?Options For Referral Outpatient Therapy;Medication Management;Chemical Dependency Intensive Outpatient Therapy (CDIOP)  ? ? ?

## 2021-03-27 NOTE — BH Assessment (Signed)
Comprehensive Clinical Assessment (CCA) Note  03/27/2021 Jeremiah [name] [id number]  Disposition: Sindy Guadeloupe, NP, recommends continual observation for safety and stabilization with psych reassessment in the AM.   The patient demonstrates the following risk factors for suicide: Chronic risk factors for suicide include: psychiatric disorder of depression and anxiety and substance use disorder. Acute risk factors for suicide include: N/A. Protective factors for this patient include: positive social support, positive therapeutic relationship, responsibility to others (children, family), coping skills, and hope for the future. Considering these factors, the overall suicide risk at this point appears to be high. Patient is not appropriate for outpatient follow up.  Flowsheet Row ED from 03/26/2021 in [facility] Admission (Discharged) from [facility]  C-SSRS RISK CATEGORY High Risk No Risk      Jeremiah [name] is a 21 year old male presenting voluntary as a transfer from [facility] to [facility] due to worsening depression and intoxication. Patient has past history of depression and anxiety. Patient reported SI with no plan. Patient denies HI. Patient reported worsening depression and anxiety this week. Patient reported he has not taking psych medications in the past 2 days. Patient reported not feeling fully functional in life. Stressors include "trying to be successful in life". Patient reported drinking alcohol heavily today, amount unknown. Patient BAL prior to being transferred was BAL 250. Patient reported varied sleep due to working 3rd shift and reports poor appetite.       Patient is currently being seen at [facility] by Jerrol Banana for medication management and has a therapist, unable to recall name. Patient reported a medication adjustment.       Patient resides with mother. Patient is currently employed at [employer] and  reports no work-related stressors and enjoys going to work. Patient denied access to guns. Patient was calm and cooperative during assessment.  Chief Complaint:  Chief Complaint  Patient presents with   Depression   Visit Diagnosis:  Major depressive disorder Alcohol dependence   CCA Screening, Triage and Referral (STR)  Patient Reported Information How did you hear about [place]? Self  What Is the Reason for Your Visit/Call Today? Jeremiah [name] is a 21 year old male presenting voluntary as a transfer from [facility] to [facility] due to worsening depression and intoxication. Patient has past history of depression and anxiety. Patient reported SI with no plan. Patient denies HI. Patient reported worsening depression and anxiety this week. Patient reported he has not taking psych medications in the past 2 days. Patient reported not feeling fully functional in life. Stressors include "trying to be successful in life". Patient reported drinking alcohol heavily today, amount unknown. Patient BAL prior to being transferred was BAL 250. Patient reported varied sleep due to working 3rd shift and reports poor appetite.     Patient is currently being seen at [facility] by Jerrol Banana for medication management and has a therapist, unable to recall name. Patient reported a medication adjustment.     Patient resides with mother. Patient is currently employed at [employer] and reports no work-related stressors and enjoys going to work. Patient denied access to guns. Patient was calm and cooperative during assessment.  How Long Has This Been Causing You Problems? > than 6 months  What Do You Feel Would Help You the Most Today? Treatment for Depression or other mood problem   Have You Recently Had Any Thoughts About Hurting Yourself? Yes  Are You Planning to Commit Suicide/Harm Yourself  At This time? No   Have you Recently Had Thoughts About Hurting Someone [name]? No  Are You Planning to Harm Someone at This Time?  No  Explanation: No data recorded  Have You Used Any Alcohol or Drugs in the Past 24 Hours? Yes  How Long Ago Did You Use Drugs or Alcohol? No data recorded What Did You Use and How Much? "a lot"   Do You Currently Have a Therapist/Psychiatrist? Yes  Name of Therapist/Psychiatrist: [facility]   Have You Been Recently Discharged From Any Office Practice or Programs? No  Explanation of Discharge From Practice/Program: No data recorded    CCA Screening Triage Referral Assessment Type of Contact: Face-to-Face  Telemedicine Service Delivery:   Is this Initial or Reassessment? No data recorded Date Telepsych consult ordered in [facility]:  No data recorded Time Telepsych consult ordered in [facility]L:  No data recorded Location of Assessment: [facility]  Provider Location: [facility] Assessment Services   Collateral Involvement: none   Does Patient Have a Automotive engineer Guardian? No data recorded Name and Contact of Legal Guardian: No data recorded If Minor and Not Living with Parent(s), Who has Custody? No data recorded Is CPS involved or ever been involved? Never  Is APS involved or ever been involved? Never   Patient Determined To Be At Risk for Harm To Self or Others Based on Review of Patient Reported Information or Presenting Complaint? No data recorded Method: No data recorded Availability of Means: No data recorded Intent: No data recorded Notification Required: No data recorded Additional Information for Danger to Others Potential: No data recorded Additional Comments for Danger to Others Potential: No data recorded Are There Guns or Other Weapons in Your Home? No data recorded Types of Guns/Weapons: No data recorded Are These Weapons Safely Secured?                            No data recorded Who Could Verify You Are Able To Have These Secured: No data recorded Do You Have any Outstanding Charges, Pending Court Dates, Parole/Probation? No data  recorded Contacted To Inform of Risk of Harm To Self or Others: No data recorded   Does Patient Present under Involuntary Commitment? No  IVC Papers Initial File Date: No data recorded  [place] of Residence: [place]   Patient Currently Receiving the Following Services: No data recorded  Determination of Need: Urgent (48 hours)   Options For Referral: Outpatient Therapy; Medication Management; Chemical Dependency Intensive Outpatient Therapy (CDIOP)     CCA Biopsychosocial Patient Reported Schizophrenia/Schizoaffective Diagnosis in Past: No data recorded  Strengths: self-awareness   Mental Health Symptoms Depression:   Hopelessness; Fatigue; Difficulty Concentrating; Change in energy/activity; Increase/decrease in appetite; Irritability; Sleep (too much or little); Tearfulness; Worthlessness   Duration of Depressive symptoms:    Mania:  No data recorded  Anxiety:    Worrying; Tension; Sleep; Restlessness   Psychosis:   None   Duration of Psychotic symptoms:    Trauma:   None   Obsessions:   None   Compulsions:   None   Inattention:   None   Hyperactivity/Impulsivity:   None   Oppositional/Defiant Behaviors:   None   Emotional Irregularity:   None   Other Mood/Personality Symptoms:  No data recorded   Mental Status Exam Appearance and self-care  Stature:   Average   Weight:   Average weight   Clothing:   Neat/clean  Grooming:   Normal   Cosmetic use:   None   Posture/gait:   Normal   Motor activity:   Not Remarkable   Sensorium  Attention:   Normal   Concentration:   Normal   Orientation:   X5   Recall/memory:   Normal   Affect and Mood  Affect:   Anxious; Appropriate; Depressed   Mood:   Anxious; Depressed   Relating  Eye contact:   Normal   Facial expression:   Anxious; Sad   Attitude toward examiner:   Cooperative   Thought and Language  Speech flow:  Normal   Thought content:   Appropriate to  Mood and Circumstances   Preoccupation:   None   Hallucinations:   None   Organization:  No data recorded  [employer] of Knowledge:   Average   Intelligence:   Average   Abstraction:   Normal   Judgement:   Impaired   Reality Testing:   -- Rich Reining)   Insight:   Fair   Decision Making:   Confused   Social Functioning  Social Maturity:  No data recorded  Social Judgement:   Heedless   Stress  Stressors:   -- (anxiety and depression)   Coping Ability:   Human resources officer Deficits:   Self-control; Decision making   Supports:   Family     Religion: Religion/Spirituality Are You A Religious Person?:  Industrial/product designer)  Leisure/Recreation: Leisure / Recreation Do You Have Hobbies?: Yes Leisure and Hobbies: hanging with girlfriend and friends  Exercise/Diet: Exercise/Diet Do You Exercise?:  (uta) Do You Follow a Special Diet?:  (uta) Do You Have Any Trouble Sleeping?: No   CCA Employment/Education Employment/Work Situation: Employment / Work Situation Employment Situation: Employed Work Stressors: none reported Patient's Job has Been Impacted by Current Illness: No  Education: Education Is Patient Currently Attending School?: No Last Grade Completed: 12 Did You Product manager?: No Did You Have An Individualized Education Program (IIEP):  (uta) Did You Have Any Difficulty At [employer]?:  (uta) Patient's Education Has Been Impacted by Current Illness:  (uta)   CCA Family/Childhood History Family and Relationship History: Family history Marital status: Single Does patient have children?: No  Childhood History:  Childhood History By whom was/is the patient raised?: Mother Did patient suffer any verbal/emotional/physical/sexual abuse as a child?: No Has patient ever been sexually abused/assaulted/raped as an adolescent or adult?: No Witnessed domestic violence?: No Has patient been affected by domestic violence as an adult?:  No  Child/Adolescent Assessment:     CCA Substance Use Alcohol/Drug Use: Alcohol / Drug Use Pain Medications: Please see MAR Prescriptions: Please see MAR Over the Counter: Please see MAR History of alcohol / drug use?: Yes (alcohol, unable to answer questions at this time) Longest period of sobriety (when/how long): Unknown                         ASAM's:  Six Dimensions of Multidimensional Assessment  Dimension 1:  Acute Intoxication and/or Withdrawal Potential:      Dimension 2:  Biomedical Conditions and Complications:      Dimension 3:  Emotional, Behavioral, or Cognitive Conditions and Complications:     Dimension 4:  Readiness to Change:     Dimension 5:  Relapse, Continued use, or Continued Problem Potential:     Dimension 6:  Recovery/Living Environment:     ASAM Severity Score:    ASAM Recommended Level of Treatment:  Substance use Disorder (SUD)    Recommendations for Services/Supports/Treatments: Recommendations for Services/Supports/Treatments Recommendations For Services/Supports/Treatments: Individual Therapy, CD-IOP Intensive Chemical Dependency Program  Discharge Disposition:    DSM5 Diagnoses: Patient Active Problem List   Diagnosis Date Noted   Hearing loss of both ears due to cerumen impaction 10/25/2019   Persistent depressive disorder with atypical features, currently mild 03/22/2019   Social anxiety disorder 02/07/2019   Attention deficit hyperactivity disorder (ADHD), combined type, moderate 11/24/2018   Generalized anxiety disorder 11/24/2018   Panic disorder 10/27/2018   Migraine with aura and without status migrainosus, not intractable 06/14/2018     Referrals to Alternative Service(s): Referred to Alternative Service(s):   Place:   Date:   Time:    Referred to Alternative Service(s):   Place:   Date:   Time:    Referred to Alternative Service(s):   Place:   Date:   Time:    Referred to Alternative Service(s):   Place:    Date:   Time:     Burnetta Sabin, [facility]

## 2021-03-27 NOTE — ED Notes (Signed)
Pt belongings in locker #29. 

## 2021-03-27 NOTE — Progress Notes (Signed)
Patient resting with unlabored respirations, no acute distress observed. Nursing staff will continue to monitor. ?

## 2021-03-27 NOTE — ED Notes (Signed)
Pt sleeping in no acute distress. RR even and unlabored. Safety maintained. 

## 2021-04-02 DIAGNOSIS — F1320 Sedative, hypnotic or anxiolytic dependence, uncomplicated: Secondary | ICD-10-CM | POA: Diagnosis not present

## 2021-04-02 DIAGNOSIS — F411 Generalized anxiety disorder: Secondary | ICD-10-CM | POA: Diagnosis not present

## 2021-04-07 DIAGNOSIS — F1020 Alcohol dependence, uncomplicated: Secondary | ICD-10-CM | POA: Diagnosis not present

## 2021-04-07 DIAGNOSIS — F1320 Sedative, hypnotic or anxiolytic dependence, uncomplicated: Secondary | ICD-10-CM | POA: Diagnosis not present

## 2021-04-08 DIAGNOSIS — F1020 Alcohol dependence, uncomplicated: Secondary | ICD-10-CM | POA: Diagnosis not present

## 2021-04-08 DIAGNOSIS — F1320 Sedative, hypnotic or anxiolytic dependence, uncomplicated: Secondary | ICD-10-CM | POA: Diagnosis not present

## 2021-04-09 DIAGNOSIS — F1020 Alcohol dependence, uncomplicated: Secondary | ICD-10-CM | POA: Diagnosis not present

## 2021-04-09 DIAGNOSIS — F1320 Sedative, hypnotic or anxiolytic dependence, uncomplicated: Secondary | ICD-10-CM | POA: Diagnosis not present

## 2021-04-10 DIAGNOSIS — F1320 Sedative, hypnotic or anxiolytic dependence, uncomplicated: Secondary | ICD-10-CM | POA: Diagnosis not present

## 2021-04-10 DIAGNOSIS — F1020 Alcohol dependence, uncomplicated: Secondary | ICD-10-CM | POA: Diagnosis not present

## 2021-04-17 ENCOUNTER — Telehealth (HOSPITAL_COMMUNITY): Payer: Self-pay | Admitting: Family Medicine

## 2021-04-17 NOTE — BH Assessment (Signed)
Care Management - [place] Follow Up Discharges  ? ?Writer attempted to make contact with patient today and was unsuccessful.  Writer left a HIPPA compliant voice message.  ? ?Per chart review, patient was will follow up with established provider at [facility] with Tyrone Sage for medication management and outpatient therapy.  ? ?

## 2021-04-29 DIAGNOSIS — F331 Major depressive disorder, recurrent, moderate: Secondary | ICD-10-CM | POA: Diagnosis not present

## 2021-05-06 ENCOUNTER — Ambulatory Visit (INDEPENDENT_AMBULATORY_CARE_PROVIDER_SITE_OTHER): Payer: Self-pay | Admitting: Adult Health

## 2021-05-06 DIAGNOSIS — Z91199 Patient's noncompliance with other medical treatment and regimen due to unspecified reason: Secondary | ICD-10-CM

## 2021-05-06 DIAGNOSIS — F331 Major depressive disorder, recurrent, moderate: Secondary | ICD-10-CM | POA: Diagnosis not present

## 2021-05-06 NOTE — Progress Notes (Signed)
Patient no show appointment. ? ?

## 2021-05-13 DIAGNOSIS — F331 Major depressive disorder, recurrent, moderate: Secondary | ICD-10-CM | POA: Diagnosis not present

## 2021-05-29 DIAGNOSIS — F1320 Sedative, hypnotic or anxiolytic dependence, uncomplicated: Secondary | ICD-10-CM | POA: Diagnosis not present

## 2021-05-29 DIAGNOSIS — F411 Generalized anxiety disorder: Secondary | ICD-10-CM | POA: Diagnosis not present

## 2021-05-31 DIAGNOSIS — F331 Major depressive disorder, recurrent, moderate: Secondary | ICD-10-CM | POA: Diagnosis not present

## 2021-06-13 DIAGNOSIS — F331 Major depressive disorder, recurrent, moderate: Secondary | ICD-10-CM | POA: Diagnosis not present

## 2021-06-23 DIAGNOSIS — F411 Generalized anxiety disorder: Secondary | ICD-10-CM | POA: Diagnosis not present

## 2021-06-24 DIAGNOSIS — F331 Major depressive disorder, recurrent, moderate: Secondary | ICD-10-CM | POA: Diagnosis not present

## 2021-06-25 ENCOUNTER — Other Ambulatory Visit: Payer: Self-pay | Admitting: Adult Health

## 2021-06-25 DIAGNOSIS — F411 Generalized anxiety disorder: Secondary | ICD-10-CM

## 2021-06-25 DIAGNOSIS — F4010 Social phobia, unspecified: Secondary | ICD-10-CM

## 2021-06-25 DIAGNOSIS — F410 Panic disorder [episodic paroxysmal anxiety] without agoraphobia: Secondary | ICD-10-CM

## 2021-06-25 NOTE — Telephone Encounter (Signed)
Last filled 5/22, due 6/19 

## 2021-06-27 NOTE — Telephone Encounter (Signed)
Last filled 5/22, due 6/19

## 2021-06-30 NOTE — Telephone Encounter (Signed)
Needs appointment scheduled and we can send in enough until then.

## 2021-06-30 NOTE — Telephone Encounter (Signed)
Please call to schedule appt.  Last filled 5/22, Last seen in January with RTC in 3 months. Was a no show for F/U and no appt currently.

## 2021-07-01 IMAGING — CT CT ABD-PELV W/ CM
2 of 8 series · 11 of 46 positions shown, 17 images · IV contrast (OMNIPAQUE 300)
Comparison: None.

CLINICAL DATA: Mid abdominal pain X 4 days, fever

EXAM:
CT ABDOMEN AND PELVIS WITH CONTRAST
TECHNIQUE: Multidetector CT imaging of the abdomen and pelvis was performed
using the standard protocol following bolus administration of
intravenous contrast. Repeat scanning was performed because of
patient motion during the initial acquisition.
CONTRAST:  100mL OMNIPAQUE IOHEXOL 300 MG/ML SOLN nausea/vomiting
after contrast administration.

[Series 5: coronal st · coronal · 0.88mm/px · 3 of 141 slices shown, 4 images]
[im 36/141  soft-tissue]
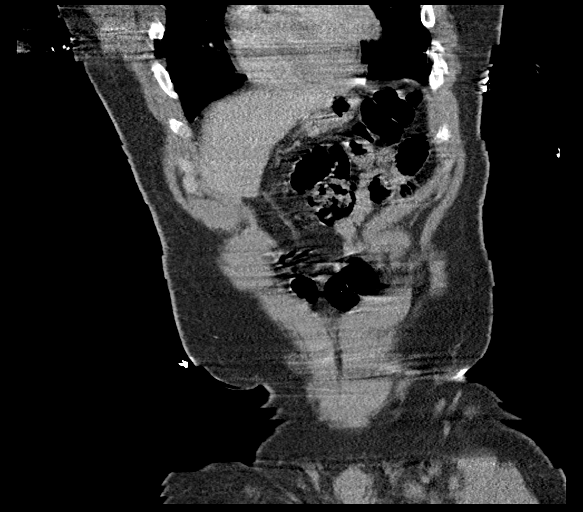
[im 71/141  soft-tissue]
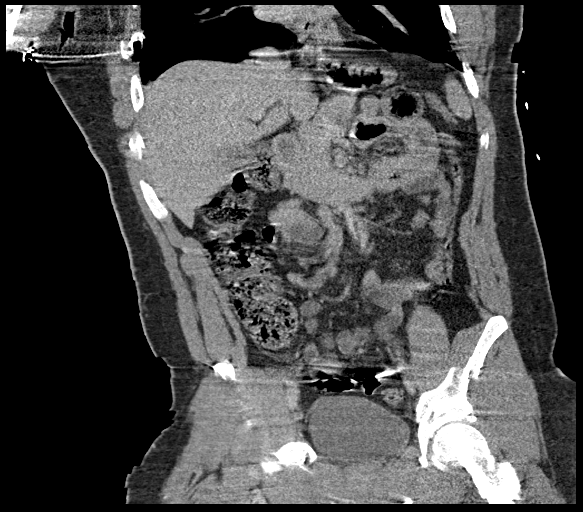
[im 71/141  bone]
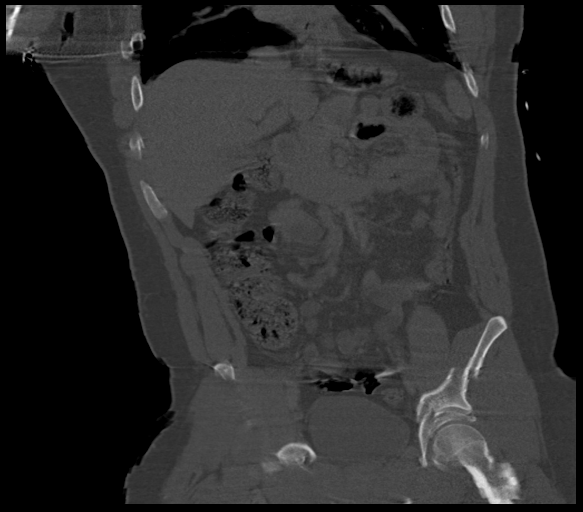
[im 106/141  soft-tissue]
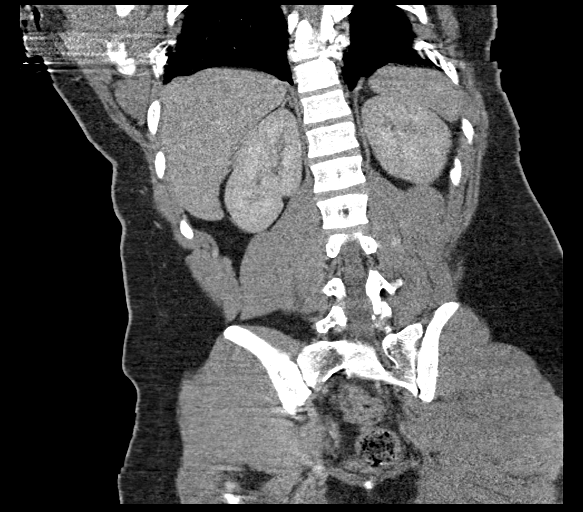

[Series 8: axial st · axial · 0.83mm/px · z∈[+1247,+1622]mm · 8 of 97 slices shown, 13 images]
[im 11/97  soft-tissue]
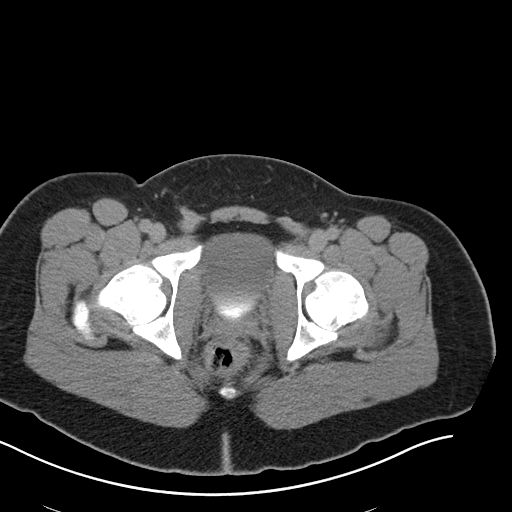
[im 11/97  bone]
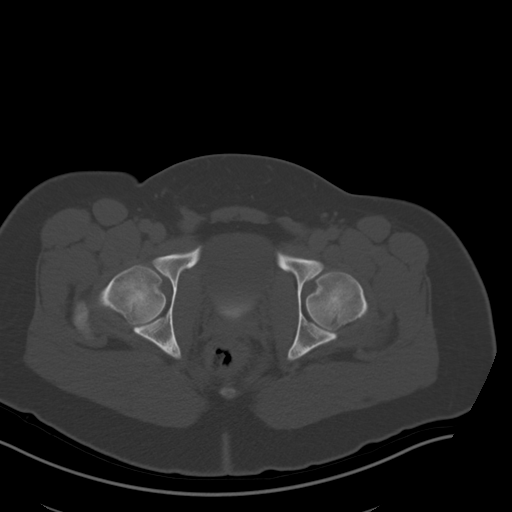
[im 22/97  soft-tissue]
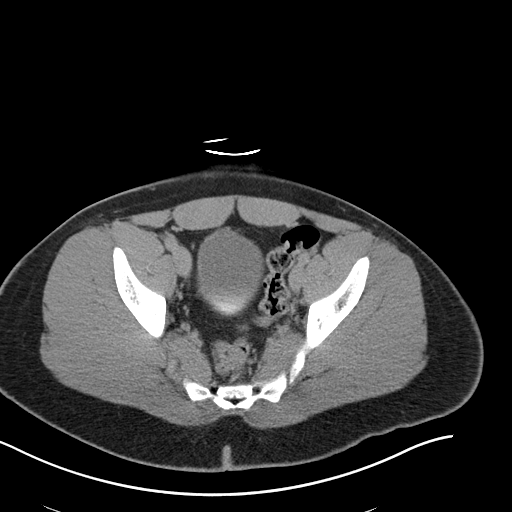
[im 33/97  soft-tissue]
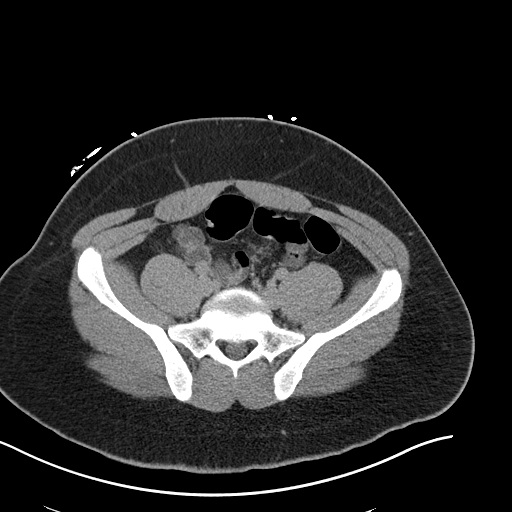
[im 43/97  soft-tissue]
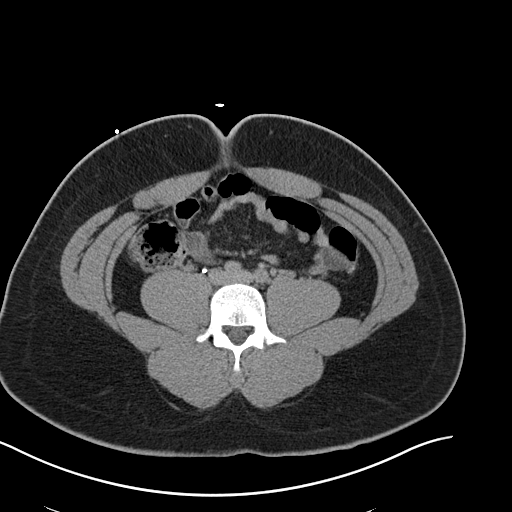
[im 54/97  soft-tissue]
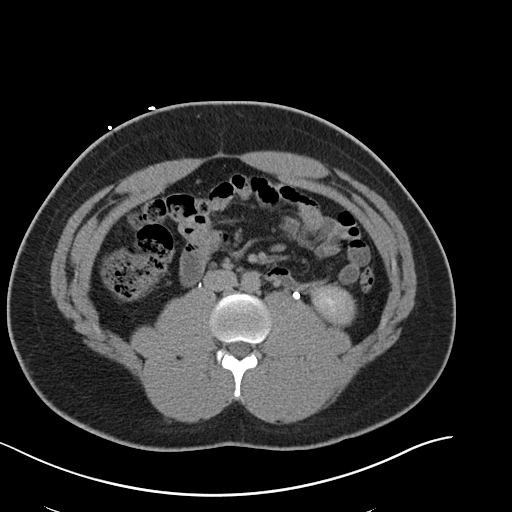
[im 54/97  lung]
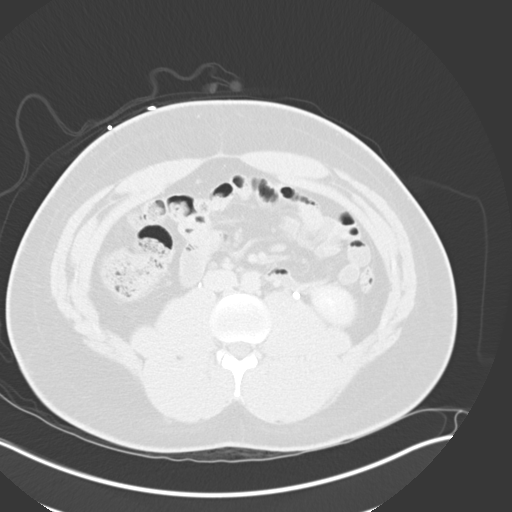
[im 65/97  soft-tissue]
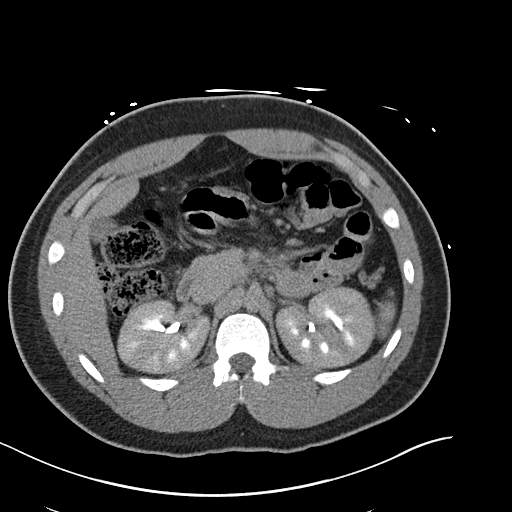
[im 65/97  lung]
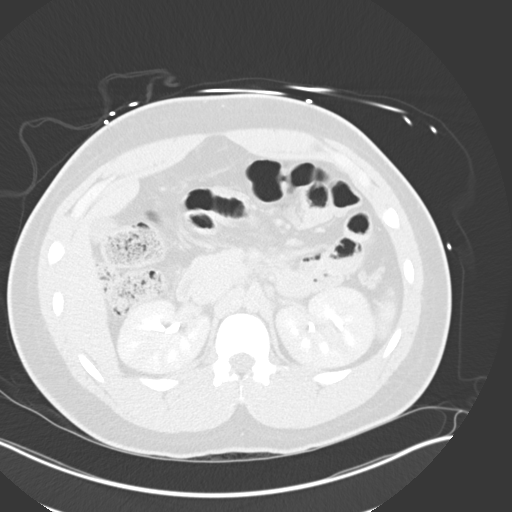
[im 75/97  soft-tissue]
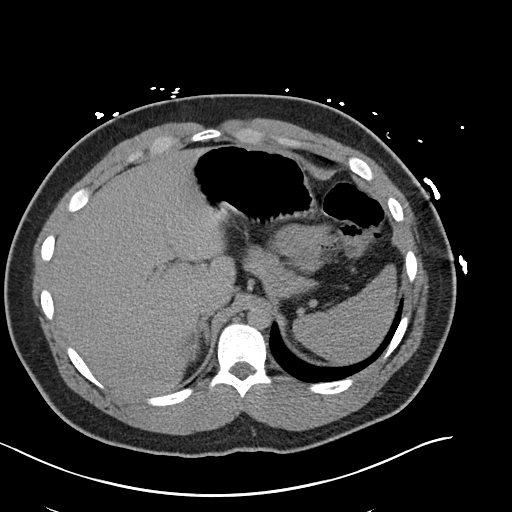
[im 75/97  lung]
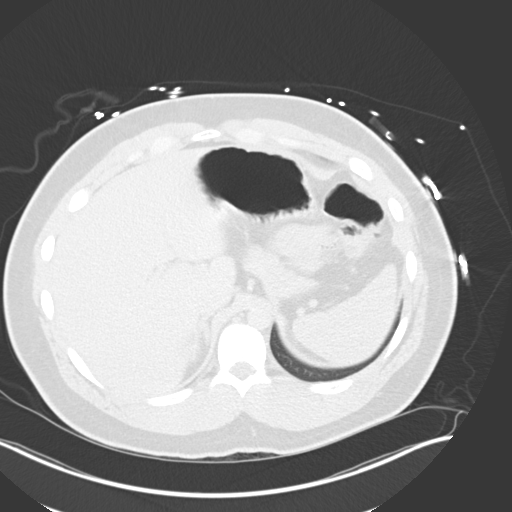
[im 86/97  soft-tissue]
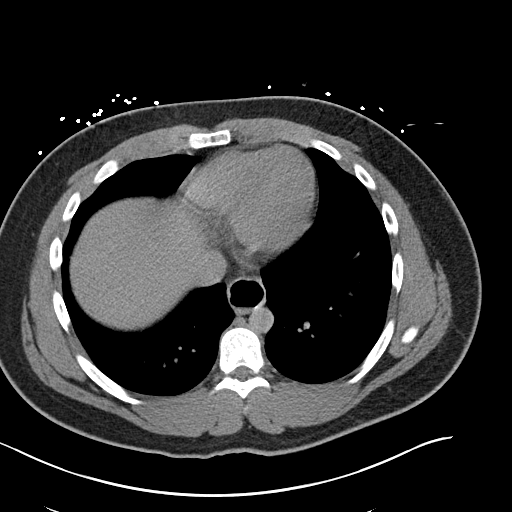
[im 86/97  lung]
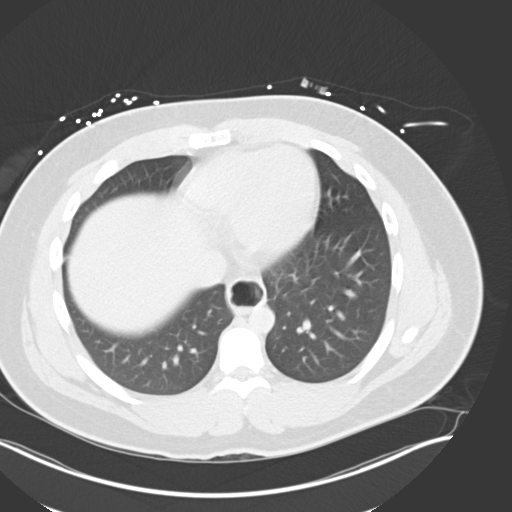

[11 of 46 positions shown; findings below may reference images not displayed]

FINDINGS: Lower chest: No acute abnormality.  Small hiatal hernia.

Hepatobiliary: No focal liver abnormality is seen. No gallstones,
gallbladder wall thickening, or biliary dilatation.

Pancreas: Unremarkable. No pancreatic ductal dilatation or
surrounding inflammatory changes.

Spleen: Normal in size without focal abnormality.

Adrenals/Urinary Tract: Adrenal glands are unremarkable. Kidneys are
normal, without renal calculi, focal lesion, or hydronephrosis.
Bladder is unremarkable.

Stomach/Bowel: Stomach is nondistended. Small bowel decompressed.
Normal appendix. The colon is nondilated with moderate fecal
material proximally, decompressed and unremarkable distally.

Vascular/Lymphatic: No significant vascular findings are present. No
enlarged abdominal or pelvic lymph nodes.

Reproductive: Prostate is unremarkable.

Other: No ascites.  No free air.

Musculoskeletal: No fracture or worrisome bone lesion. Bilateral L5
pars defects without anterolisthesis.
IMPRESSION: 1. No acute findings.
2. Small hiatal hernia.
3. Bilateral L5 pars defects without anterolisthesis.

## 2021-07-02 ENCOUNTER — Encounter: Payer: Self-pay | Admitting: Adult Health

## 2021-07-02 ENCOUNTER — Ambulatory Visit (INDEPENDENT_AMBULATORY_CARE_PROVIDER_SITE_OTHER): Payer: BC Managed Care – PPO | Admitting: Adult Health

## 2021-07-02 DIAGNOSIS — F331 Major depressive disorder, recurrent, moderate: Secondary | ICD-10-CM

## 2021-07-02 DIAGNOSIS — F410 Panic disorder [episodic paroxysmal anxiety] without agoraphobia: Secondary | ICD-10-CM | POA: Diagnosis not present

## 2021-07-02 DIAGNOSIS — F4010 Social phobia, unspecified: Secondary | ICD-10-CM | POA: Diagnosis not present

## 2021-07-02 DIAGNOSIS — F902 Attention-deficit hyperactivity disorder, combined type: Secondary | ICD-10-CM

## 2021-07-02 DIAGNOSIS — F411 Generalized anxiety disorder: Secondary | ICD-10-CM

## 2021-07-02 MED ORDER — ESCITALOPRAM OXALATE 10 MG PO TABS: 10.0000 mg | ORAL_TABLET | Freq: Every day | ORAL | 2 refills | Status: DC

## 2021-07-02 MED ORDER — PROPRANOLOL HCL 10 MG PO TABS: 10.0000 mg | ORAL_TABLET | Freq: Two times a day (BID) | ORAL | 2 refills | Status: DC

## 2021-07-02 NOTE — Progress Notes (Signed)
Devaughn Savant [id number] 18-Apr-2000 21 y.o.  Subjective:   Patient ID:  Jeremiah [name] is a 21 y.o. (DOB 12/25/2000) male.  Chief Complaint: No chief complaint on file.   HPI Jeremiah [name] presents to the office today for follow-up of panic attacks, MDD, GAD, ADD and social anxiety disorder.   Describes mood today as "ok". Pleasant. Mood symptoms - denies depression, anxiety or irritability. Reports one recent   panic attack. Stating "I'm doing alright". Feels like addition of Lexapro has been helpful. Spending time with family and girlfriend. Stable interest and motivation. Taking medications as prescribed. Seeing therapist. Energy levels stable. Active, does not have a regular exercise routine.   Enjoys some usual interests and activities. Has a girlfriend. Lives with mother and bunny - "[place]". Spending time with family. Appetite adequate. Weight loss 247 pounds.  Sleeps well most nights. Averages 6 to 8 hours.   Focus and concentration stable. Completing tasks. Managing aspects of household. Unemployed currently. Denies SI or HI.  Denies AH or VH. Consumes some alcohol. Last THC use 2 weeks ago.  Recently seen at [facility] for passive SI - reported alcohol and THC use.   Previous medication trials: Trazadone, Cymbalta, Lexapro, Hydroxyzine  AIMS    Flowsheet Row Admission (Discharged) [facility]  AIMS Total Score 0      AUDIT    Flowsheet Row Admission (Discharged) from [facility]  Alcohol Use Disorder Identification Test Final Score (AUDIT) 0      Flowsheet Row ED from 03/26/2021 in [facility] Admission (Discharged) from OP [facility]  C-SSRS RISK CATEGORY High Risk No Risk        Review of Systems:  Review of Systems  Musculoskeletal:  Negative for gait  problem.  Neurological:  Negative for tremors.  Psychiatric/Behavioral:         Please refer to HPI    Medications: I have reviewed the patient's current medications.  Current Outpatient Medications  Medication Sig Dispense Refill   propranolol (INDERAL) 10 MG tablet Take 1 tablet (10 mg total) by mouth 2 (two) times daily. 60 tablet 2   escitalopram (LEXAPRO) 10 MG tablet Take 1 tablet (10 mg total) by mouth daily. 30 tablet 2   No current facility-administered medications for this visit.    Medication Side Effects: None  Allergies:  Allergies  Allergen Reactions   Pollen Extract Other (See Comments)    Runny nose, sneezing    Past Medical History:  Diagnosis Date   ADHD (attention deficit hyperactivity disorder)    Allergic rhinitis    Anxiety    Back pain    Depression    Fatigue    Headache    Migraine    Obesity (BMI 30.0-34.9)     Past Medical History, Surgical history, Social history, and Family history were reviewed and updated as appropriate.   Please see review of systems for further details on the patient's review from today.   Objective:   Physical Exam:  There were no vitals taken for this visit.  Physical Exam Constitutional:      General: He is not in acute distress. Musculoskeletal:        General: No deformity.  Neurological:     Mental Status: He is alert and oriented to person, place, and time.     Coordination: Coordination  normal.  Psychiatric:        Attention and Perception: Attention and perception normal. He does not perceive auditory or visual hallucinations.        Mood and Affect: Mood normal. Mood is not anxious or depressed. Affect is not labile, blunt, angry or inappropriate.        Speech: Speech normal.        Behavior: Behavior normal.        Thought Content: Thought content normal. Thought content is not paranoid or delusional. Thought content does not include homicidal or suicidal ideation. Thought content does not  include homicidal or suicidal plan.        Cognition and Memory: Cognition and memory normal.        Judgment: Judgment normal.     Comments: Insight intact     Lab Review:     Component Value Date/Time   NA 141 03/26/2021 2325   NA 142 06/13/2018 1533   K 3.9 03/26/2021 2325   CL 107 03/26/2021 2325   CO2 28 03/26/2021 2325   GLUCOSE 104 (H) 03/26/2021 2325   BUN 10 03/26/2021 2325   BUN 15 06/13/2018 1533   CREATININE 0.85 03/26/2021 2325   CALCIUM 8.7 (L) 03/26/2021 2325   PROT 7.8 03/26/2021 2325   PROT 6.9 06/13/2018 1533   ALBUMIN 4.4 03/26/2021 2325   ALBUMIN 4.4 06/13/2018 1533   AST 30 03/26/2021 2325   ALT 36 03/26/2021 2325   ALKPHOS 90 03/26/2021 2325   BILITOT 0.2 (L) 03/26/2021 2325   BILITOT 0.2 06/13/2018 1533   GFRNONAA >60 03/26/2021 2325   GFRAA >60 10/28/2018 0627       Component Value Date/Time   WBC 6.3 03/26/2021 2325   RBC 5.34 03/26/2021 2325   HGB 14.1 03/26/2021 2325   HGB 13.8 06/13/2018 1533   HCT 44.4 03/26/2021 2325   HCT 40.4 06/13/2018 1533   PLT 291 03/26/2021 2325   PLT 283 06/13/2018 1533   MCV 83.1 03/26/2021 2325   MCV 77 (L) 06/13/2018 1533   MCH 26.4 03/26/2021 2325   MCHC 31.8 03/26/2021 2325   RDW 14.3 03/26/2021 2325   RDW 13.3 06/13/2018 1533   LYMPHSABS 0.5 (L) 01/06/2020 0645   MONOABS 0.8 01/06/2020 0645   EOSABS 0.1 01/06/2020 0645   BASOSABS 0.0 01/06/2020 0645    No results found for: "POCLITH", "LITHIUM"   No results found for: "PHENYTOIN", "PHENOBARB", "VALPROATE", "CBMZ"   .res Assessment: Plan:    Plan:  PDMP reviewed  Lexapro 10mg  daily Add Propranolol 10mg  BID  Will not refill Xanax at this time with recent ED visit - alcohol and THC use - patient last Xanax use was a month ago - no taper needed. Patient has also started seeing a provider with [facility] and will continue care there moving forward.   RTC 3 months  Patient advised to contact office with any questions, adverse effects, or acute  worsening in signs and symptoms.  Discussed potential benefits, risk, and side effects of benzodiazepines to include potential risk of tolerance and dependence, as well as possible drowsiness.  Advised patient not to drive if experiencing drowsiness and to take lowest possible effective dose to minimize risk of dependence and tolerance.  Discussed potential benefits, risks, and side effects of stimulants with patient to include increased heart rate, palpitations, insomnia, increased anxiety, increased irritability, or decreased appetite.  Instructed patient to contact office if experiencing any significant tolerability issues.   Diagnoses and all orders  for this visit:  Generalized anxiety disorder -     escitalopram (LEXAPRO) 10 MG tablet; Take 1 tablet (10 mg total) by mouth daily.  Social anxiety disorder  Panic disorder  Attention deficit hyperactivity disorder (ADHD), combined type, moderate  Major depressive disorder, recurrent episode, moderate (HCC)  Other orders -     propranolol (INDERAL) 10 MG tablet; Take 1 tablet (10 mg total) by mouth 2 (two) times daily.     Please see After Visit Summary for patient specific instructions.  Future Appointments  Date Time Provider Department Center  07/30/2021  2:40 PM Izic Stfort, Thereasa Solo, NP CP-CP None    No orders of the defined types were placed in this encounter.   -------------------------------

## 2021-07-02 NOTE — Telephone Encounter (Signed)
Has appt today with Almira Coaster

## 2021-07-02 NOTE — Telephone Encounter (Signed)
Medication will no longer be prescribed. It was discontinued today.

## 2021-07-04 DIAGNOSIS — F331 Major depressive disorder, recurrent, moderate: Secondary | ICD-10-CM | POA: Diagnosis not present

## 2021-07-16 DIAGNOSIS — F1320 Sedative, hypnotic or anxiolytic dependence, uncomplicated: Secondary | ICD-10-CM | POA: Diagnosis not present

## 2021-07-16 DIAGNOSIS — F411 Generalized anxiety disorder: Secondary | ICD-10-CM | POA: Diagnosis not present

## 2021-07-28 DIAGNOSIS — F331 Major depressive disorder, recurrent, moderate: Secondary | ICD-10-CM | POA: Diagnosis not present

## 2021-07-30 ENCOUNTER — Ambulatory Visit (INDEPENDENT_AMBULATORY_CARE_PROVIDER_SITE_OTHER): Payer: Self-pay | Admitting: Adult Health

## 2021-07-30 DIAGNOSIS — F489 Nonpsychotic mental disorder, unspecified: Secondary | ICD-10-CM

## 2021-07-30 NOTE — Progress Notes (Signed)
Patient no show appointment. ? ?

## 2021-08-19 ENCOUNTER — Other Ambulatory Visit: Payer: Self-pay | Admitting: Adult Health

## 2021-08-19 DIAGNOSIS — F411 Generalized anxiety disorder: Secondary | ICD-10-CM | POA: Diagnosis not present

## 2021-08-19 DIAGNOSIS — F1320 Sedative, hypnotic or anxiolytic dependence, uncomplicated: Secondary | ICD-10-CM | POA: Diagnosis not present

## 2021-08-19 NOTE — Telephone Encounter (Signed)
Please call the patient to schedule an appt, was a no show for last F/U.

## 2021-08-22 NOTE — Telephone Encounter (Signed)
Apt made for August 17

## 2021-08-28 ENCOUNTER — Ambulatory Visit (INDEPENDENT_AMBULATORY_CARE_PROVIDER_SITE_OTHER): Payer: Self-pay | Admitting: Adult Health

## 2021-08-28 ENCOUNTER — Other Ambulatory Visit: Payer: Self-pay | Admitting: Adult Health

## 2021-08-28 DIAGNOSIS — F489 Nonpsychotic mental disorder, unspecified: Secondary | ICD-10-CM

## 2021-08-28 NOTE — Progress Notes (Signed)
Patient no show appointment. ? ?

## 2021-09-09 DIAGNOSIS — F411 Generalized anxiety disorder: Secondary | ICD-10-CM | POA: Diagnosis not present

## 2021-09-09 DIAGNOSIS — F1320 Sedative, hypnotic or anxiolytic dependence, uncomplicated: Secondary | ICD-10-CM | POA: Diagnosis not present

## 2021-09-30 DIAGNOSIS — F331 Major depressive disorder, recurrent, moderate: Secondary | ICD-10-CM | POA: Diagnosis not present

## 2021-10-02 ENCOUNTER — Other Ambulatory Visit: Payer: Self-pay | Admitting: Adult Health

## 2021-11-05 DIAGNOSIS — R109 Unspecified abdominal pain: Secondary | ICD-10-CM | POA: Diagnosis not present

## 2021-11-05 DIAGNOSIS — R1110 Vomiting, unspecified: Secondary | ICD-10-CM | POA: Diagnosis not present

## 2021-11-11 DIAGNOSIS — J069 Acute upper respiratory infection, unspecified: Secondary | ICD-10-CM | POA: Diagnosis not present

## 2021-11-11 DIAGNOSIS — J039 Acute tonsillitis, unspecified: Secondary | ICD-10-CM | POA: Diagnosis not present

## 2021-12-21 DIAGNOSIS — H6123 Impacted cerumen, bilateral: Secondary | ICD-10-CM | POA: Diagnosis not present

## 2021-12-26 DIAGNOSIS — U071 COVID-19: Secondary | ICD-10-CM | POA: Diagnosis not present

## 2021-12-26 DIAGNOSIS — J069 Acute upper respiratory infection, unspecified: Secondary | ICD-10-CM | POA: Diagnosis not present

## 2021-12-26 DIAGNOSIS — R109 Unspecified abdominal pain: Secondary | ICD-10-CM | POA: Diagnosis not present

## 2022-05-03 ENCOUNTER — Other Ambulatory Visit: Payer: Self-pay

## 2022-05-03 ENCOUNTER — Emergency Department (HOSPITAL_COMMUNITY)
Admission: EM | Admit: 2022-05-03 | Discharge: 2022-05-03 | Disposition: A | Discharge status: Home / Self Care | Payer: Medicaid Other | Attending: Emergency Medicine | Admitting: Emergency Medicine

## 2022-05-03 ENCOUNTER — Encounter (HOSPITAL_COMMUNITY): Payer: Self-pay

## 2022-05-03 DIAGNOSIS — J029 Acute pharyngitis, unspecified: Secondary | ICD-10-CM | POA: Diagnosis present

## 2022-05-03 DIAGNOSIS — Z1152 Encounter for screening for COVID-19: Secondary | ICD-10-CM | POA: Insufficient documentation

## 2022-05-03 LAB — SARS CORONAVIRUS 2 BY RT PCR: SARS Coronavirus 2 by RT PCR: NEGATIVE

## 2022-05-03 LAB — MONONUCLEOSIS SCREEN: Mono Screen: NEGATIVE

## 2022-05-03 NOTE — ED Triage Notes (Signed)
C/o mid abd pain with sore throat, headache, cough, congestion.  Referred by urgent care [facility] - at [facility] Denies sob

## 2022-05-03 NOTE — ED Provider Notes (Signed)
[place] EMERGENCY DEPARTMENT AT [facility] Provider Note   CSN: [id number] Arrival date & time: 05/03/22  1118     History  Chief Complaint  Patient presents with   Sore Throat    Jeremiah [name] is a 22 y.o. male.   Sore Throat   22 year old male presents emergency department with complaints of sore throat, cough, nasal congestion.  Patient states that symptoms been present for the past 2 days.  Was seen at urgent care earlier today with negative strep testing at that time.  Presented to the emergency department for second opinion.  Denies difficulty breathing/swallowing, fever, chest pain, shortness of breath, nausea, vomiting, urinary symptoms, change in bowel habits.  Patient reports epigastric abdominal pain which has been intermittently present for the past year and states no acute worsening today; requesting information for GI for follow-up.  Denies concern for STDs in the oropharynx.  Up-to-date on childhood vaccinations per patient.  Past medical history significant for ADHD, depression, headache, migraine, obesity, generalized anxiety disorder  Home Medications Prior to Admission medications   Medication Sig Start Date End Date Taking? Authorizing Provider  escitalopram (LEXAPRO) 10 MG tablet Take 1 tablet (10 mg total) by mouth daily. 07/02/21   Mozingo, Thereasa Solo, NP  propranolol (INDERAL) 10 MG tablet Take 1 tablet (10 mg total) by mouth 2 (two) times daily. 07/02/21   Mozingo, Thereasa Solo, NP      Allergies    Pollen extract    Review of Systems   Review of Systems  All other systems reviewed and are negative.   Physical Exam Updated Vital Signs BP 132/66 (BP Location: Left Arm)   Pulse 96   Temp 98.4 F (36.9 C) (Oral)   Resp 18   Wt 108 kg   SpO2 100%   BMI 35.16 kg/m  Physical Exam Vitals and nursing note reviewed.  Constitutional:      General: He is not in acute distress.    Appearance: He is well-developed.  HENT:     Head:  Normocephalic and atraumatic.     Right Ear: Tympanic membrane normal.     Left Ear: Tympanic membrane normal.     Nose: Congestion and rhinorrhea present.     Mouth/Throat:     Comments: Patient with mild to moderate posterior pharyngeal erythema.  Uvula midline and rises symmetrically with phonation.  Tonsils 1+ bilaterally with mild exudate appreciated on the left tonsil.  No sublingual or submandibular swelling appreciated.  No trismus.  No changes in phonation.  Patient tolerating oral secretions without difficulty.  No appreciable stridor. Eyes:     Conjunctiva/sclera: Conjunctivae normal.  Cardiovascular:     Rate and Rhythm: Normal rate and regular rhythm.     Heart sounds: No murmur heard. Pulmonary:     Effort: Pulmonary effort is normal. No respiratory distress.     Breath sounds: Normal breath sounds. No wheezing, rhonchi or rales.  Abdominal:     Palpations: Abdomen is soft.     Tenderness: There is no abdominal tenderness.  Musculoskeletal:        General: No swelling.     Cervical back: Neck supple.     Right lower leg: No edema.  Skin:    General: Skin is warm and dry.     Capillary Refill: Capillary refill takes less than 2 seconds.  Neurological:     Mental Status: He is alert.  Psychiatric:        Mood and Affect: Mood  normal.     ED Results / Procedures / Treatments   Labs (all labs ordered are listed, but only abnormal results are displayed) Labs Reviewed  SARS CORONAVIRUS 2 BY RT PCR  CULTURE, GROUP A STREP [facility])  MONONUCLEOSIS SCREEN    EKG None  Radiology No results found.  Procedures Procedures    Medications Ordered in ED Medications - No data to display  ED Course/ Medical Decision Making/ A&P                             Medical Decision Making Amount and/or Complexity of Data Reviewed Labs: ordered.   This patient presents to the ED for concern of sore throat, this involves an extensive number of treatment options, and is a  complaint that carries with it a high risk of complications and morbidity.  The differential diagnosis includes viral pharyngitis, strep pharyngitis, Lemierre's disease, Ludwig angina, peritonsillar abscess   Co morbidities that complicate the patient evaluation  See HPI   Additional history obtained:  Additional history obtained from EMR External records from outside source obtained and reviewed including hospital records   Lab Tests:  I Ordered, and personally interpreted labs.  The pertinent results include: COVID negative.  Mononucleosis and throat culture pending   Imaging Studies ordered:  N/a   Cardiac Monitoring: / EKG:  The patient was maintained on a cardiac monitor.  I personally viewed and interpreted the cardiac monitored which showed an underlying rhythm of: Sinus rhythm   Consultations Obtained:  N/a   Problem List / ED Course / Critical interventions / Medication management  Sore throat Reevaluation of the patient showed that the patient stayed the same I have reviewed the patients home medicines and have made adjustments as needed   Social Determinants of Health:  Denies tobacco, illicit drug use   Test / Admission - Considered:  Sore throat Vitals signs  within normal range and stable throughout visit. Laboratory studies significant for: See above 22 year old male presents emergency department with complaints of sore throat, nasal congestion and cough.  Patient with negative strep a test by urgent care earlier today as well as with negative COVID testing at this time.  Further assessment was obtained via strep throat culture as well as mononucleosis at patient request.  Patient elicited no concern for gonorrhea/chlamydia in the oropharynx so further testing not pursued at this time.  Patient overall well-appearing, tolerating oral secretions without difficulty, in no acute distress.  Low suspicion for peritonsillar abscess, Ludwig angina.   Recommend symptomatic therapy at home Tylenol/Motrin, antihistamine, nasal steroid spray and close follow-up with PCP for reevaluation.  Patient educated to follow MyChart for results of further testing.  Treatment plan discussed at length with patient and he acknowledged understanding was agreeable to said plan. Worrisome signs and symptoms were discussed with the patient, and the patient acknowledged understanding to return to the ED if noticed. Patient was stable upon discharge.          Final Clinical Impression(s) / ED Diagnoses Final diagnoses:  Sore throat    Rx / DC Orders ED Discharge Orders     None         Peter Garter, [place] 05/03/22 1229    Benjiman Core, MD 05/03/22 1513

## 2022-05-03 NOTE — Discharge Instructions (Addendum)
Note the workup today was overall reassuring.  Your COVID test was negative.  Follow MyChart for the results of your mononucleosis as well as throat culture tests and seek treatment if positive.  Recommend treatment with symptoms at home with nasal steroid spray such as Nasacort/Flonase, antihistamine/allergy medicine such as Zyrtec/Allegra/Claritin, Tylenol/Motrin as needed for pain as well as Cepacol throat lozenges.  Attached is information for GI follow-up.  Please do not hesitate to return to emergency department for worrisome signs and symptoms we discussed become apparent.

## 2022-05-06 ENCOUNTER — Encounter: Payer: Self-pay | Admitting: Nurse Practitioner

## 2022-05-06 ENCOUNTER — Other Ambulatory Visit (INDEPENDENT_AMBULATORY_CARE_PROVIDER_SITE_OTHER): Payer: Medicaid Other

## 2022-05-06 ENCOUNTER — Ambulatory Visit: Payer: Medicaid Other | Admitting: Nurse Practitioner

## 2022-05-06 VITALS — BP 110/70 | HR 67 | Ht 69.5 in | Wt 230.0 lb

## 2022-05-06 DIAGNOSIS — R112 Nausea with vomiting, unspecified: Secondary | ICD-10-CM

## 2022-05-06 DIAGNOSIS — R1012 Left upper quadrant pain: Secondary | ICD-10-CM | POA: Diagnosis not present

## 2022-05-06 DIAGNOSIS — K5900 Constipation, unspecified: Secondary | ICD-10-CM

## 2022-05-06 DIAGNOSIS — R1013 Epigastric pain: Secondary | ICD-10-CM

## 2022-05-06 DIAGNOSIS — R7989 Other specified abnormal findings of blood chemistry: Secondary | ICD-10-CM

## 2022-05-06 LAB — CBC WITH DIFFERENTIAL/PLATELET
Basophils Absolute: 0 10*3/uL (ref 0.0–0.1)
Basophils Relative: 0.8 % (ref 0.0–3.0)
Eosinophils Absolute: 0.7 10*3/uL (ref 0.0–0.7)
Eosinophils Relative: 14.6 % — ABNORMAL HIGH (ref 0.0–5.0)
HCT: 43.8 % (ref 39.0–52.0)
Hemoglobin: 14.3 g/dL (ref 13.0–17.0)
Lymphocytes Relative: 36.3 % (ref 12.0–46.0)
Lymphs Abs: 1.8 10*3/uL (ref 0.7–4.0)
MCHC: 32.5 g/dL (ref 30.0–36.0)
MCV: 81.1 fl (ref 78.0–100.0)
Monocytes Absolute: 0.6 10*3/uL (ref 0.1–1.0)
Monocytes Relative: 11.8 % (ref 3.0–12.0)
Neutro Abs: 1.8 10*3/uL (ref 1.4–7.7)
Neutrophils Relative %: 36.5 % — ABNORMAL LOW (ref 43.0–77.0)
Platelets: 250 10*3/uL (ref 150.0–400.0)
RBC: 5.4 Mil/uL (ref 4.22–5.81)
RDW: 15.1 % (ref 11.5–15.5)
WBC: 5 10*3/uL (ref 4.0–10.5)

## 2022-05-06 LAB — COMPREHENSIVE METABOLIC PANEL
ALT: 68 U/L — ABNORMAL HIGH (ref 0–53)
AST: 37 U/L (ref 0–37)
Albumin: 4.1 g/dL (ref 3.5–5.2)
Alkaline Phosphatase: 73 U/L (ref 39–117)
BUN: 16 mg/dL (ref 6–23)
CO2: 29 mEq/L (ref 19–32)
Calcium: 9.2 mg/dL (ref 8.4–10.5)
Chloride: 104 mEq/L (ref 96–112)
Creatinine, Ser: 0.86 mg/dL (ref 0.40–1.50)
GFR: 122.41 mL/min (ref >60.00)
Glucose, Bld: 83 mg/dL (ref 70–99)
Potassium: 3.9 mEq/L (ref 3.5–5.1)
Sodium: 141 mEq/L (ref 135–145)
Total Bilirubin: 0.2 mg/dL (ref 0.2–1.2)
Total Protein: 7.2 g/dL (ref 6.0–8.3)

## 2022-05-06 LAB — CULTURE, GROUP A STREP (THRC)

## 2022-05-06 LAB — TSH: TSH: 2.45 u[IU]/mL (ref 0.35–5.50)

## 2022-05-06 MED ORDER — OMEPRAZOLE 20 MG PO CPDR: 20.0000 mg | DELAYED_RELEASE_CAPSULE | Freq: Every day | ORAL | 1 refills | Status: AC

## 2022-05-06 NOTE — Progress Notes (Signed)
Agree with assessment/plan.  Raj Kyshawn Teal, MD [facility] [phone]  

## 2022-05-06 NOTE — Patient Instructions (Addendum)
Your provider has requested that you go to the basement level for lab work before leaving today. Press "B" on the elevator. The lab is located at the first door on the left as you exit the elevator.  We have sent the following medications to your pharmacy for you to pick up at your convenience: Omeprazole   Purchase over the counter Miralax and take 17grams daily at bedtime.  You have been scheduled for an abdominal ultrasound at [facility] (1st floor of hospital) on 05/14/2022 at 7:00AM. Please arrive 15 minutes prior to your appointment for registration. Make certain not to have anything to eat or drink 6 hours prior to your appointment. Should you need to reschedule your appointment, please contact radiology at [phone]. This test typically takes about 30 minutes to perform.   _______________________________________________________  If your blood pressure at your visit was 140/90 or greater, please contact your primary care physician to follow up on this.  _______________________________________________________  If you are age 20 or older, your body mass index should be between 23-30. Your Body mass index is 33.48 kg/m. If this is out of the aforementioned range listed, please consider follow up with your Primary Care Provider.  If you are age 52 or younger, your body mass index should be between 19-25. Your Body mass index is 33.48 kg/m. If this is out of the aformentioned range listed, please consider follow up with your Primary Care Provider.   ________________________________________________________  The [facility] providers would like to encourage you to use [facility] to communicate with providers for non-urgent requests or questions.  Due to long hold times on the telephone, sending your provider a message by [facility] may be a faster and more efficient way to get a response.  Please allow 48 business hours for a response.  Please remember that this is for non-urgent  requests.  _______________________________________________________  I appreciate the opportunity to care for you. Alcide Evener      V

## 2022-05-06 NOTE — Progress Notes (Signed)
05/06/2022 Jeremiah [name] [id number] 2000-12-02   CHIEF COMPLAINT: Abdominal pain   HISTORY OF PRESENT ILLNESS: Jeremiah [name] is a 22 year old male with past medical history of anxiety, depression, ADHD, obesity, migraine headaches and chronic back pain.  He presents to our office today self-referred for further evaluation regarding upper abdominal pain.  He is accompanied by his mother.  He endorsed having random epigastric and LUQ pain which initially started 2 years ago and occurs once every few months and last for 2 to 3 days.  He described his upper abdominal pain as sharp and sometimes occurs after eating and other times occurs randomly.  No specific food triggers.  He ate a hamburger and [place] fries 2 to 3 days ago which resulted in nausea he subsequently vomited up most of the [place] fries he ate.  No coffee ground or frank hematemesis.  No further vomiting since then.  No NSAID use.  He takes Tylenol for migraine headaches.  He eats a fairly healthy diet but intermittently eats fatty foods/fast foods.  No coffee or sodas.  He passes a normal formed brown bowel movement every other day.  No rectal bleeding or black stools.      Latest Ref Rng & Units 03/26/2021   11:25 PM 01/06/2020    6:45 AM 10/28/2018    6:27 AM  CBC  WBC 4.0 - 10.5 K/uL 6.3  8.3  4.5   Hemoglobin 13.0 - 17.0 g/dL 78.2  95.6  21.3   Hematocrit 39.0 - 52.0 % 44.4  41.4  46.6   Platelets 150 - 400 K/uL 291  209  284        Latest Ref Rng & Units 03/26/2021   11:25 PM 01/06/2020    6:45 AM 10/28/2018    6:27 AM  CMP  Glucose 70 - 99 mg/dL 086  98  75   BUN 6 - 20 mg/dL Creatinine 0.61 - 1.24 mg/dL 5.78  4.69  6.29   Sodium 135 - 145 mmol/L 141  138  140   Potassium 3.5 - 5.1 mmol/L 3.9  3.6  4.1   Chloride 98 - 111 mmol/L 107  103  104   CO2 22 - 32 mmol/L Calcium 8.9 - 10.3 mg/dL 8.7  9.5  9.2   Total Protein 6.5 - 8.1 g/dL 7.8  7.8    Total Bilirubin 0.3 - 1.2 mg/dL 0.2  0.7     Alkaline Phos 38 - 126 U/L 90  75    AST 15 - 41 U/L 30  38    ALT 0 - 44 U/L 36  35       Past Medical History:  Diagnosis Date   ADHD (attention deficit hyperactivity disorder)    Allergic rhinitis    Anxiety    Back pain    Depression    Fatigue    Headache    Migraine    Obesity (BMI 30.0-34.9)    Past Surgical History:  Procedure Laterality Date   NO PAST SURGERIES     Social History: He is single.  No tobacco use.  He occasionally smokes marijuana, last occurred 2 weeks ago.  No other drug use.  He drinks 2 shots of tequila or 4 ounces of whiskey once or twice monthly.  He drinks tequila 2 shots or whiskey 4 oz glasses once or twice monthsly    Family History:  Father with history of bipolar disorder and CAD/MI.  Mother with history of hypertension, diabetes and migraine headaches.  Maternal grandmother with diabetes.  No known family history of esophageal, gastric or colorectal cancer.   Allergies  Allergen Reactions   Pollen Extract Other (See Comments)    Runny nose, sneezing      Outpatient Encounter Medications as of 05/06/2022  Medication Sig   escitalopram (LEXAPRO) 10 MG tablet Take 1 tablet (10 mg total) by mouth daily.   propranolol (INDERAL) 10 MG tablet Take 1 tablet (10 mg total) by mouth 2 (two) times daily.   No facility-administered encounter medications on file as of 05/06/2022.    REVIEW OF SYSTEMS:  Gen: + Fatigue. Denies fever, sweats or chills. No weight loss.  CV: Denies chest pain, palpitations or edema. Resp: + Cough and sore throat. No shortness of breath of hemoptysis.  GI: See HPI, no GERD symptoms.   GU : Denies urinary burning, blood in urine, increased urinary frequency or incontinence. MS: + Back pain and muscle cramps. Derm: Denies rash, itchiness, skin lesions or unhealing ulcers. Psych: + Anxiety and depression. Heme: Denies bruising, easy bleeding. Neuro: + Headaches.  Endo:  Denies any problems with DM, thyroid or adrenal  function.  PHYSICAL EXAM: BP 110/70   Pulse 67   Ht 5' 9.5" (1.765 m)   Wt 230 lb (104.3 kg)   BMI 33.48 kg/m   General: 22 year old male in no acute distress. Head: Normocephalic and atraumatic. Eyes:  Sclerae non-icteric, conjunctive pink. Ears: Normal auditory acuity. Mouth: Dentition intact. No ulcers or lesions.  Neck: Supple, no lymphadenopathy or thyromegaly.  Lungs: Clear bilaterally to auscultation without wheezes, crackles or rhonchi. Heart: Regular rate and rhythm. No murmur, rub or gallop appreciated.  Abdomen: Soft, nontender, nondistended. No masses. No hepatosplenomegaly. Normoactive bowel sounds x 4 quadrants.  Rectal: Deferred. Musculoskeletal: Symmetrical with no gross deformities. Skin: Warm and dry. No rash or lesions on visible extremities. Extremities: No edema. Neurological: Alert oriented x 4, no focal deficits.  Psychological:  Alert and cooperative. Normal mood and affect.  ASSESSMENT AND PLAN:  22 year old male with intermittent epigastric and LUQ pain x 2 years with nausea/vomiting 2 to 3 days ago.  No hematemesis.  No NSAID use. -CBC, CMP -RUQ sonogram to evaluate the gallbladder -H. pylori stool antigen -Avoid fatty foods -Omeprazole 20 mg 1 p.o. daily to be taken 30 minutes before breakfast -I discussed scheduling an EGD if the above evaluation unrevealing and if his symptoms persist/worsen -Follow-up with Dr. Chales Abrahams in 2 months -Patient to contact office if his symptoms worsen  Constipation -TSH -MiraLAX nightly as tolerated to increase stool output    CC:  Farris Has, MD

## 2022-05-14 ENCOUNTER — Ambulatory Visit (HOSPITAL_COMMUNITY)
Admission: RE | Admit: 2022-05-14 | Discharge: 2022-05-14 | Disposition: A | Discharge status: Home / Self Care | Payer: Medicaid Other | Source: Ambulatory Visit | Attending: Nurse Practitioner | Admitting: Nurse Practitioner

## 2022-05-14 DIAGNOSIS — R1013 Epigastric pain: Secondary | ICD-10-CM | POA: Diagnosis present

## 2022-05-14 DIAGNOSIS — R112 Nausea with vomiting, unspecified: Secondary | ICD-10-CM | POA: Diagnosis present

## 2022-05-14 DIAGNOSIS — R1012 Left upper quadrant pain: Secondary | ICD-10-CM | POA: Diagnosis present

## 2022-08-04 ENCOUNTER — Ambulatory Visit: Payer: Medicaid Other | Admitting: Gastroenterology
